# Patient Record
Sex: Male | Born: 1950
Health system: Southern US, Community
[De-identification: ages and names within clinical notes are randomized; demographics above are authoritative.]

## PROBLEM LIST (undated history)

## (undated) DIAGNOSIS — I2510 Atherosclerotic heart disease of native coronary artery without angina pectoris: Secondary | ICD-10-CM

## (undated) DIAGNOSIS — I10 Essential (primary) hypertension: Secondary | ICD-10-CM

## (undated) DIAGNOSIS — E785 Hyperlipidemia, unspecified: Secondary | ICD-10-CM

## (undated) HISTORY — PX: CORONARY ARTERY BYPASS GRAFT: SHX141

## (undated) HISTORY — PX: TONSILLECTOMY AND ADENOIDECTOMY: SUR1326

## (undated) HISTORY — PX: VASECTOMY: SHX75

---

## 2004-12-02 ENCOUNTER — Ambulatory Visit: Payer: Self-pay | Admitting: Unknown Physician Specialty

## 2007-02-11 ENCOUNTER — Other Ambulatory Visit: Payer: Self-pay

## 2007-02-11 ENCOUNTER — Emergency Department: Payer: Self-pay | Admitting: Emergency Medicine

## 2008-02-26 DIAGNOSIS — J45909 Unspecified asthma, uncomplicated: Secondary | ICD-10-CM | POA: Insufficient documentation

## 2008-05-21 DIAGNOSIS — C4491 Basal cell carcinoma of skin, unspecified: Secondary | ICD-10-CM

## 2008-05-21 HISTORY — DX: Basal cell carcinoma of skin, unspecified: C44.91

## 2008-07-05 ENCOUNTER — Ambulatory Visit: Payer: Self-pay | Admitting: Unknown Physician Specialty

## 2009-01-07 DIAGNOSIS — I10 Essential (primary) hypertension: Secondary | ICD-10-CM | POA: Insufficient documentation

## 2013-07-27 ENCOUNTER — Ambulatory Visit: Payer: Self-pay | Admitting: Unknown Physician Specialty

## 2013-07-27 LAB — HM COLONOSCOPY

## 2013-07-31 LAB — PATHOLOGY REPORT

## 2013-12-07 DIAGNOSIS — I5022 Chronic systolic (congestive) heart failure: Secondary | ICD-10-CM | POA: Insufficient documentation

## 2013-12-07 DIAGNOSIS — I25810 Atherosclerosis of coronary artery bypass graft(s) without angina pectoris: Secondary | ICD-10-CM | POA: Insufficient documentation

## 2014-01-10 ENCOUNTER — Emergency Department: Payer: Self-pay | Admitting: Emergency Medicine

## 2014-01-10 LAB — COMPREHENSIVE METABOLIC PANEL WITH GFR
Albumin: 3.6 g/dL (ref 3.4–5.0)
Alkaline Phosphatase: 159 U/L — ABNORMAL HIGH
Anion Gap: 5 — ABNORMAL LOW (ref 7–16)
BUN: 17 mg/dL (ref 7–18)
Bilirubin,Total: 1.4 mg/dL — ABNORMAL HIGH (ref 0.2–1.0)
Calcium, Total: 8.5 mg/dL (ref 8.5–10.1)
Chloride: 104 mmol/L (ref 98–107)
Co2: 31 mmol/L (ref 21–32)
Creatinine: 1.15 mg/dL (ref 0.60–1.30)
EGFR (African American): >60
EGFR (Non-African Amer.): >60
Glucose: 86 mg/dL (ref 65–99)
Osmolality: 280 (ref 275–301)
Potassium: 3.8 mmol/L (ref 3.5–5.1)
SGOT(AST): 42 U/L — ABNORMAL HIGH (ref 15–37)
SGPT (ALT): 77 U/L — ABNORMAL HIGH
Sodium: 140 mmol/L (ref 136–145)
Total Protein: 7.1 g/dL (ref 6.4–8.2)

## 2014-01-10 LAB — CBC
HCT: 43.3 % (ref 40.0–52.0)
HGB: 14.9 g/dL (ref 13.0–18.0)
MCH: 32.1 pg (ref 26.0–34.0)
MCHC: 34.5 g/dL (ref 32.0–36.0)
MCV: 93 fL (ref 80–100)
Platelet: 208 x10 3/mm 3 (ref 150–440)
RBC: 4.65 x10 6/mm 3 (ref 4.40–5.90)
RDW: 13.1 % (ref 11.5–14.5)
WBC: 9.9 x10 3/mm 3 (ref 3.8–10.6)

## 2014-01-10 LAB — CK TOTAL AND CKMB (NOT AT ARMC)
CK, Total: 224 U/L (ref 39–308)
CK-MB: 0.9 ng/mL (ref 0.5–3.6)

## 2014-01-10 LAB — TROPONIN I: Troponin-I: <0.02 ng/mL

## 2014-03-28 LAB — BASIC METABOLIC PANEL
BUN: 19 mg/dL (ref 4–21)
CREATININE: 1.3 mg/dL (ref 0.6–1.3)
Glucose: 102 mg/dL
POTASSIUM: 4.7 mmol/L (ref 3.4–5.3)
Sodium: 142 mmol/L (ref 137–147)

## 2014-03-28 LAB — CBC AND DIFFERENTIAL
HCT: 43 % (ref 41–53)
Hemoglobin: 15 g/dL (ref 13.5–17.5)
Platelets: 230 10*3/uL (ref 150–399)
WBC: 7.7 10*3/mL

## 2014-03-28 LAB — HEPATIC FUNCTION PANEL
ALT: 18 U/L (ref 10–40)
AST: 23 U/L (ref 14–40)
Alkaline Phosphatase: 96 U/L (ref 25–125)
Bilirubin, Total: 1.2 mg/dL

## 2014-03-28 LAB — LIPID PANEL
CHOLESTEROL: 232 mg/dL — AB (ref 0–200)
HDL: 47 mg/dL (ref 35–70)
LDL Cholesterol: 161 mg/dL
Triglycerides: 120 mg/dL (ref 40–160)

## 2014-03-28 LAB — PSA: PSA: 0.4

## 2014-03-28 LAB — TSH: TSH: 2.61 u[IU]/mL (ref 0.41–5.90)

## 2015-01-09 ENCOUNTER — Other Ambulatory Visit: Payer: Self-pay | Admitting: Family Medicine

## 2015-03-04 DIAGNOSIS — N529 Male erectile dysfunction, unspecified: Secondary | ICD-10-CM | POA: Insufficient documentation

## 2015-03-04 DIAGNOSIS — E785 Hyperlipidemia, unspecified: Secondary | ICD-10-CM | POA: Insufficient documentation

## 2015-03-04 DIAGNOSIS — J4520 Mild intermittent asthma, uncomplicated: Secondary | ICD-10-CM | POA: Insufficient documentation

## 2015-03-04 DIAGNOSIS — D126 Benign neoplasm of colon, unspecified: Secondary | ICD-10-CM | POA: Insufficient documentation

## 2015-03-10 ENCOUNTER — Ambulatory Visit (INDEPENDENT_AMBULATORY_CARE_PROVIDER_SITE_OTHER): Payer: BLUE CROSS/BLUE SHIELD | Admitting: Family Medicine

## 2015-03-10 ENCOUNTER — Encounter: Payer: Self-pay | Admitting: Family Medicine

## 2015-03-10 VITALS — BP 138/90 | HR 72 | Temp 98.1°F | Resp 14 | Ht 69.5 in | Wt 190.2 lb

## 2015-03-10 DIAGNOSIS — J4520 Mild intermittent asthma, uncomplicated: Secondary | ICD-10-CM

## 2015-03-10 DIAGNOSIS — E785 Hyperlipidemia, unspecified: Secondary | ICD-10-CM | POA: Diagnosis not present

## 2015-03-10 DIAGNOSIS — I5022 Chronic systolic (congestive) heart failure: Secondary | ICD-10-CM

## 2015-03-10 DIAGNOSIS — F4329 Adjustment disorder with other symptoms: Secondary | ICD-10-CM | POA: Diagnosis not present

## 2015-03-10 DIAGNOSIS — I25810 Atherosclerosis of coronary artery bypass graft(s) without angina pectoris: Secondary | ICD-10-CM

## 2015-03-10 DIAGNOSIS — Z0000 Encounter for general adult medical examination without abnormal findings: Secondary | ICD-10-CM

## 2015-03-10 DIAGNOSIS — I10 Essential (primary) hypertension: Secondary | ICD-10-CM | POA: Diagnosis not present

## 2015-03-10 LAB — POCT URINALYSIS DIPSTICK
BILIRUBIN UA: NEGATIVE
Blood, UA: NEGATIVE
GLUCOSE UA: NEGATIVE
KETONES UA: NEGATIVE
LEUKOCYTES UA: NEGATIVE
NITRITE UA: NEGATIVE
Protein, UA: NEGATIVE
Spec Grav, UA: <=1.005
Urobilinogen, UA: 0.2
pH, UA: 7.5

## 2015-03-10 MED ORDER — TRAZODONE HCL 50 MG PO TABS: 25.0000 mg | ORAL_TABLET | Freq: Every evening | ORAL | Status: DC | PRN

## 2015-03-10 NOTE — Progress Notes (Signed)
Patient ID: Joseph Cooper, male   DOB: 08-14-1950, 65 y.o.   MRN: [id number]       Patient: Joseph Cooper, Male    DOB: Jan 06, 1951, 65 y.o.   MRN: [id number] Visit Date: 03/10/2015  Today's Provider: [name], PA   Chief Complaint  Patient presents with  . Annual Exam   Subjective:    Annual physical exam Joseph Cooper is a 65 y.o. male who presents today for health maintenance and complete physical. He feels well. He reports exercising none recently, but 3 days per week previously. He reports he is sleeping well (average 8 hours per night) .  ----------------------------------------------------------------- Tdap: 02/15/2012 Flu: 12/16/2014 Zoster: 02/15/2012 Colonoscopy: 07/27/2013 Pneumovax: 02/15/2012  Review of Systems  Constitutional: Negative.   HENT: Positive for congestion.   Eyes: Negative.   Respiratory: Negative.   Cardiovascular: Negative.   Endocrine: Negative.   Genitourinary: Negative.   Musculoskeletal: Negative.   Skin: Negative.   Allergic/Immunologic: Negative.   Neurological: Negative.   Hematological: Negative.   Psychiatric/Behavioral: Negative.     Social History      He  reports that he has never smoked. He has never used smokeless tobacco. He reports that he drinks alcohol. He reports that he does not use illicit drugs.       Social History   Social History  . Marital Status: Married    Spouse Name: N/A  . Number of Children: N/A  . Years of Education: N/A   Social History Main Topics  . Smoking status: Never Smoker   . Smokeless tobacco: Never Used  . Alcohol Use: 0.0 oz/week    0 Standard drinks or equivalent per week     Comment: occasionally   . Drug Use: No  . Sexual Activity: Not Asked   Other Topics Concern  . None   Social History Narrative    History reviewed. No pertinent past medical history.   Patient Active Problem List   Diagnosis Date Noted  . Benign neoplasm of colon 03/04/2015  . ED (erectile  dysfunction) of organic origin 03/04/2015  . HLD (hyperlipidemia) 03/04/2015  . Asthma, mild intermittent 03/04/2015  . Chronic systolic heart failure ([place]) 12/07/2013  . Arteriosclerosis of autologous vein coronary artery bypass graft 12/07/2013  . Benign essential HTN 01/07/2009  . Asthma, exogenous 02/26/2008  . CAD in native artery 01/05/2008    Past Surgical History  Procedure Laterality Date  . Vasectomy    . Coronary artery bypass graft    . Tonsillectomy and adenoidectomy      Family History        Family Status  Relation Status Death Age  . Mother Alive   . Father Deceased 18  . Sister Deceased   . Daughter Alive   . Maternal Grandmother Deceased   . Maternal Grandfather Deceased 20  . Paternal Grandmother Deceased   . Paternal Grandfather Deceased   . Daughter Alive         His family history includes CAD in his father; Diabetes in his sister; Healthy in his daughter, daughter, and mother; Heart attack in his maternal grandfather, maternal grandmother, paternal grandfather, and sister; Heart disease in his father; Stroke in his father.    No Known Allergies  Previous Medications   ASPIRIN 325 MG TABLET       CARVEDILOL (COREG) 12.5 MG TABLET    CARVEDILOL, 12.5MG  (Oral Tablet)  1 Every Day for 0 days  Quantity: 0.00;  Refills: 0  Ordered :16-Jan-2010  Abran Richard ;  Started 07-Jan-2009 Active   EZETIMIBE (ZETIA) 10 MG TABLET    TAKE 1 TABLET (10 MG TOTAL) BY MOUTH ONCE DAILY.   LISINOPRIL (PRINIVIL,ZESTRIL) 20 MG TABLET    Take by mouth.   PROAIR HFA 108 (90 BASE) MCG/ACT INHALER    TAKE 1-2 PUFFS BY MOUTH 4 TIMES A DAY AS NEEDED   TRIAMTERENE-HYDROCHLOROTHIAZIDE (MAXZIDE-25) 37.5-25 MG TABLET    TRIAMTERENE-HCTZ, 37.5-25MG  (Oral Tablet)  1 Every Day for 0 days  Quantity: 0.00;  Refills: 0   Ordered :16-Jan-2010  Abran Richard ;  Started 07-Jan-2009 Active    Patient Care Team: Margo Common, PA as PCP - General (Family Medicine)     Objective:    Vitals: BP 140/88 mmHg  Pulse 72  Temp(Src) 98.1 F (36.7 C) (Oral)  Resp 14  Ht 5' 9.5" (1.765 m)  Wt 190 lb 3.2 oz (86.274 kg)  BMI 27.69 kg/m2    Physical Exam  Constitutional: He is oriented to person, place, and time. He appears well-developed and well-nourished.  HENT:  Head: Normocephalic and atraumatic.  Right Ear: External ear normal.  Left Ear: External ear normal.  Nose: Nose normal.  Mouth/Throat: Oropharynx is clear and moist.  Eyes: Conjunctivae and EOM are normal. Pupils are equal, round, and reactive to light. Right eye exhibits no discharge.  Neck: Normal range of motion. Neck supple. No tracheal deviation present. No thyromegaly present.  Cardiovascular: Normal rate, regular rhythm, normal heart sounds and intact distal pulses.   No murmur heard. Pulmonary/Chest: Effort normal and breath sounds normal. No respiratory distress. He has no wheezes. He has no rales. He exhibits no tenderness.  Status post 5 vessel CABG in 2009.  Abdominal: Soft. He exhibits no distension and no mass. There is no tenderness. There is no rebound and no guarding.  Genitourinary: Rectum normal, prostate normal and penis normal. Guaiac negative stool.  Musculoskeletal: Normal range of motion. He exhibits no edema or tenderness.  Lymphadenopathy:    He has no cervical adenopathy.  Neurological: He is alert and oriented to person, place, and time. He has normal reflexes. No cranial nerve deficit. He exhibits normal muscle tone. Coordination normal.  Skin: Skin is warm and dry. No rash noted. No erythema.  Psychiatric: His behavior is normal. Judgment and thought content normal. His mood appears anxious. His affect is blunt.   Depression Screen Having stress with job change and financial issues causing need to sell home. Feels he is not sleeping well and having some anxiety.    Assessment & Plan:     Routine Health Maintenance and Physical Exam  Exercise Activities and Dietary  recommendations Goals    Recommend he get back on his 3 day a week work out.and low fat diet.      Immunization History  Administered Date(s) Administered  . Pneumococcal Polysaccharide-23 02/15/2012  . Tdap 02/15/2012  . Zoster 02/15/2012    Health Maintenance  Topic Date Due  . Hepatitis C Screening  1950/08/12  . HIV Screening  08/01/1966  . INFLUENZA VACCINE  09/02/2015  . TETANUS/TDAP  02/14/2022  . COLONOSCOPY  07/28/2023  . ZOSTAVAX  Completed      Discussed health benefits of physical activity, and encouraged him to engage in regular exercise appropriate for his age and condition.    -------------------------------------------------------------------- 1. Annual physical exam Stable general health. Immunizations and colonoscopy up to date. Recommend he restart his routine exercise 3 times a week. - POCT  urinalysis dipstick  2. Arteriosclerosis of autologous vein coronary artery bypass graft Stable without chest pain/angina. EKG normal sinus rhythm. Recheck labs and continue annual follow up with cardiologist. - EKG 12-Lead - COMPLETE METABOLIC PANEL WITH GFR; Future - Lipid panel; Future  3. Benign essential HTN Stable and tolerating Mazide-25 qd with Lisionpril 20 mg qd and Coreg 12.5 mg qd. Recheck routine labs fasting and continue present medications. - CBC with Differential/Platelet; Future - COMPLETE METABOLIC PANEL WITH GFR; Future - Lipid panel; Future - TSH; Future  4. Chronic systolic heart failure (HCC) Stable NYHA Class II CHF followed by Dr. Nehemiah Massed (cardiologist) annually. Denies peripheral edema and significant dyspnea. - COMPLETE METABOLIC PANEL WITH GFR; Future - Lipid panel; Future  5. Asthma, mild intermittent, uncomplicated Rare flare. Keeps Albuterol inhaler handy to use prn rescue.  6. Stress and adjustment reaction Onset over the past few months and feels he is not sleeping very well. Will give trial of Trazodone to help with sleep,  anxiety and depressive reaction to stressful situation. - traZODone (DESYREL) 50 MG tablet; Take 0.5-1 tablets (25-50 mg total) by mouth at bedtime as needed for sleep.  Dispense: 30 tablet; Refill: 3  7. HLD (hyperlipidemia) Presently on Zetia 10 mg qd without side effects prescribed by Dr. Nehemiah Massed. Will recheck labs and continue present dosage with low fat diet and exercise.

## 2015-03-10 NOTE — Patient Instructions (Signed)
Exercising to Stay Healthy Exercising regularly is important. It has many health benefits, such as:  Improving your overall fitness, flexibility, and endurance.  Increasing your bone density.  Helping with weight control.  Decreasing your body fat.  Increasing your muscle strength.  Reducing stress and tension.  Improving your overall health. In order to become healthy and stay healthy, it is recommended that you do moderate-intensity and vigorous-intensity exercise. You can tell that you are exercising at a moderate intensity if you have a higher heart rate and faster breathing, but you are still able to hold a conversation. You can tell that you are exercising at a vigorous intensity if you are breathing much harder and faster and cannot hold a conversation while exercising. HOW OFTEN SHOULD I EXERCISE? Choose an activity that you enjoy and set realistic goals. Your health care provider can help you to make an activity plan that works for you. Exercise regularly as directed by your health care provider. This may include:   Doing resistance training twice each week, such as:  Push-ups.  Sit-ups.  Lifting weights.  Using resistance bands.  Doing a given intensity of exercise for a given amount of time. Choose from these options:  150 minutes of moderate-intensity exercise every week.  75 minutes of vigorous-intensity exercise every week.  A mix of moderate-intensity and vigorous-intensity exercise every week. Children, pregnant women, people who are out of shape, people who are overweight, and older adults may need to consult a health care provider for individual recommendations. If you have any sort of medical condition, be sure to consult your health care provider before starting a new exercise program.  WHAT ARE SOME EXERCISE IDEAS? Some moderate-intensity exercise ideas include:   Walking at a rate of 1 mile in 15  minutes.  Biking.  Hiking.  Golfing.  Dancing. Some vigorous-intensity exercise ideas include:   Walking at a rate of at least 4.5 miles per hour.  Jogging or running at a rate of 5 miles per hour.  Biking at a rate of at least 10 miles per hour.  Lap swimming.  Roller-skating or in-line skating.  Cross-country skiing.  Vigorous competitive sports, such as football, basketball, and soccer.  Jumping rope.  Aerobic dancing. WHAT ARE SOME EVERYDAY ACTIVITIES THAT CAN HELP ME TO GET EXERCISE?  Yard work, such as:  Pushing a lawn mower.  Raking and bagging leaves.  Washing and waxing your car.  Pushing a stroller.  Shoveling snow.  Gardening.  Washing windows or floors. HOW CAN I BE MORE ACTIVE IN MY DAY-TO-DAY ACTIVITIES?  Use the stairs instead of the elevator.  Take a walk during your lunch break.  If you drive, park your car farther away from work or school.  If you take public transportation, get off one stop early and walk the rest of the way.  Make all of your phone calls while standing up and walking around.  Get up, stretch, and walk around every 30 minutes throughout the day. WHAT GUIDELINES SHOULD I FOLLOW WHILE EXERCISING?  Do not exercise so much that you hurt yourself, feel dizzy, or get very short of breath.  Consult your health care provider before starting a new exercise program.  Wear comfortable clothes and shoes with good support.  Drink plenty of water while you exercise to prevent dehydration or heat stroke. Body water is lost during exercise and must be replaced.  Work out until you breathe faster and your heart beats faster.   This information   is not intended to replace advice given to you by your health care provider. Make sure you discuss any questions you have with your health care provider.   Document Released: 02/20/2010 Document Revised: 02/08/2014 Document Reviewed: 06/21/2013 Elsevier Interactive Patient Education  2016 [employer] Inc[employer]  

## 2015-05-24 IMAGING — CT CT HEAD WITHOUT CONTRAST
1 series · 16 of 30 positions shown, 20 images · non-contrast
Comparison: None.

CLINICAL DATA: Lightheadedness.  Vision loss.  Initial encounter.

EXAM:
CT HEAD WITHOUT CONTRAST
TECHNIQUE: Contiguous axial images were obtained from the base of the skull
through the vertex without intravenous contrast.

[Series 2: head wo · axial · 0.42mm/px · z∈[+1044,+1188]mm · 16 of 36 slices shown, 20 images]
[im 2/36  brain]
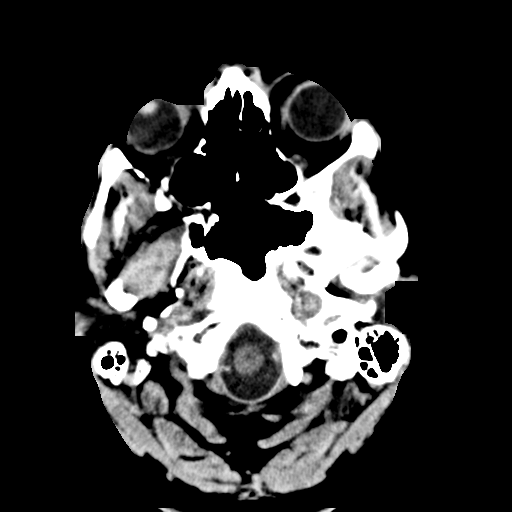
[im 2/36  bone]
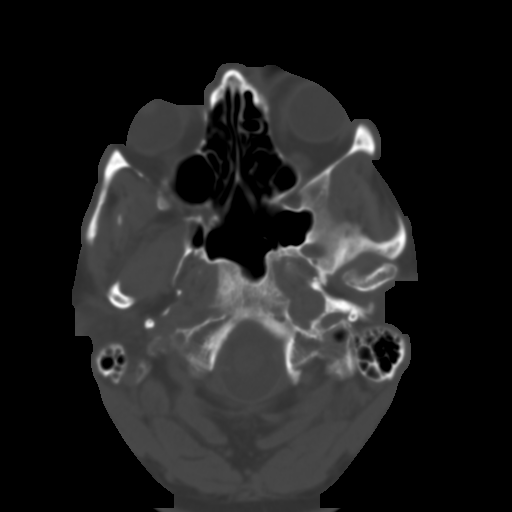
[im 4/36  brain]
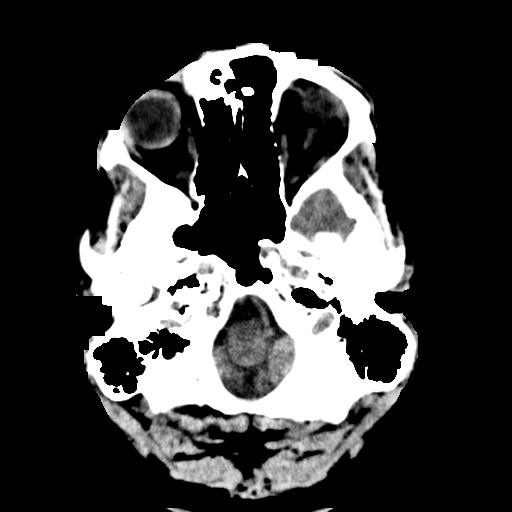
[im 7/36  brain]
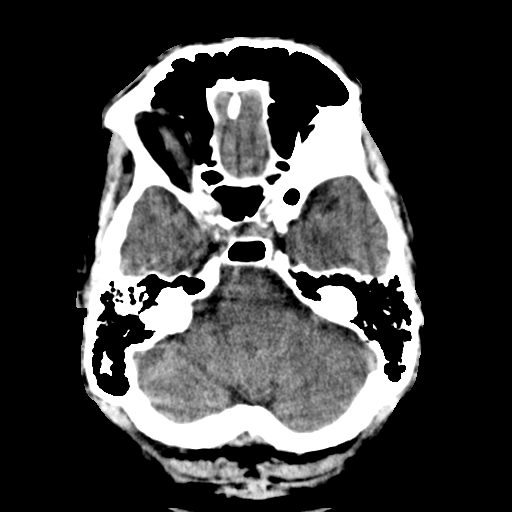
[im 9/36  brain]
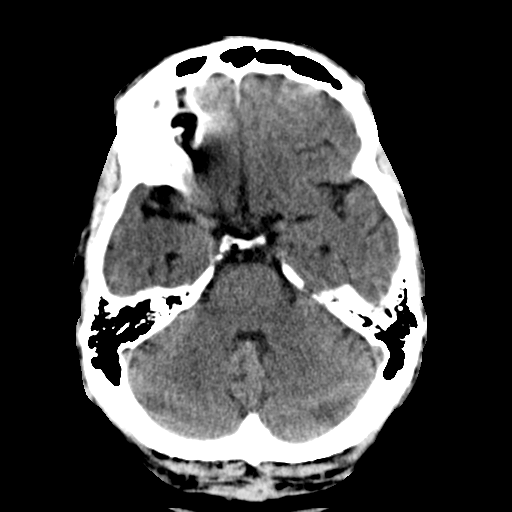
[im 10/36  brain]
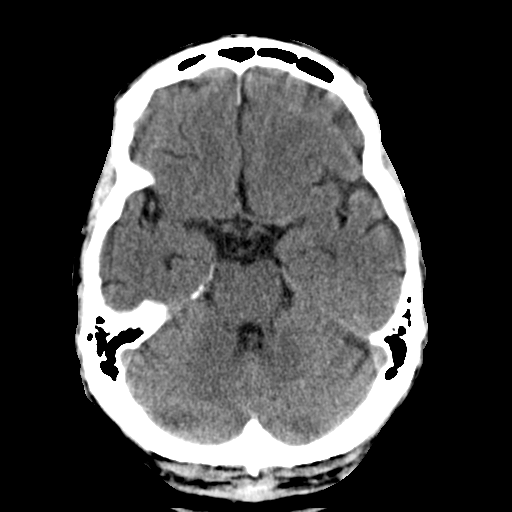
[im 10/36  bone]
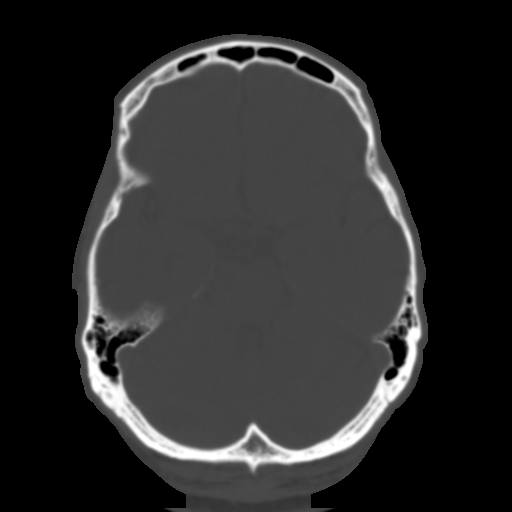
[im 13/36  brain]
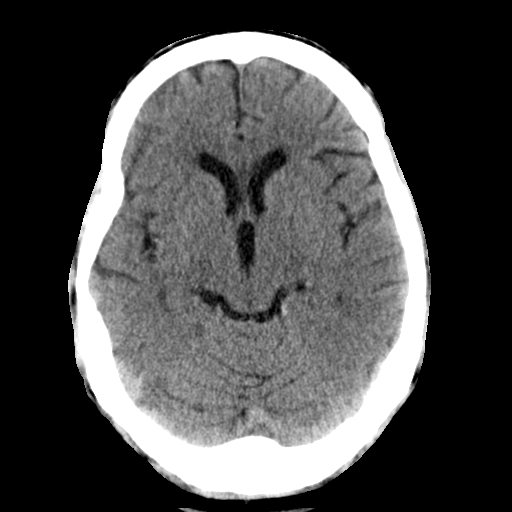
[im 15/36  brain]
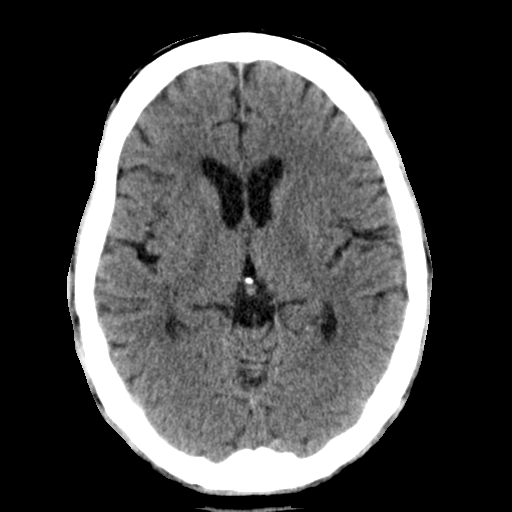
[im 17/36  brain]
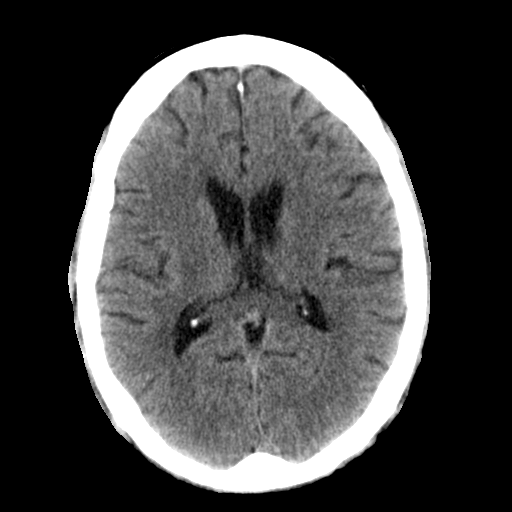
[im 19/36  brain]
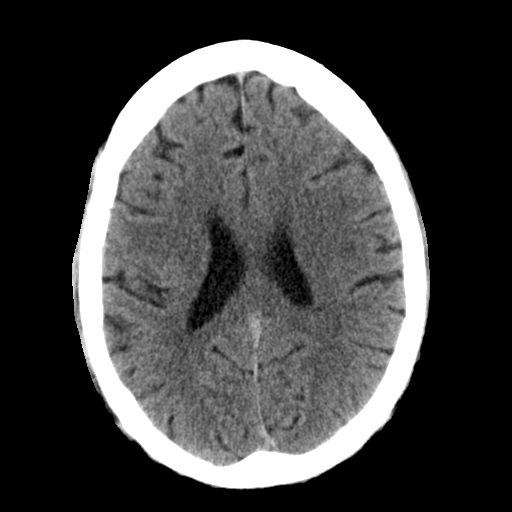
[im 19/36  bone]
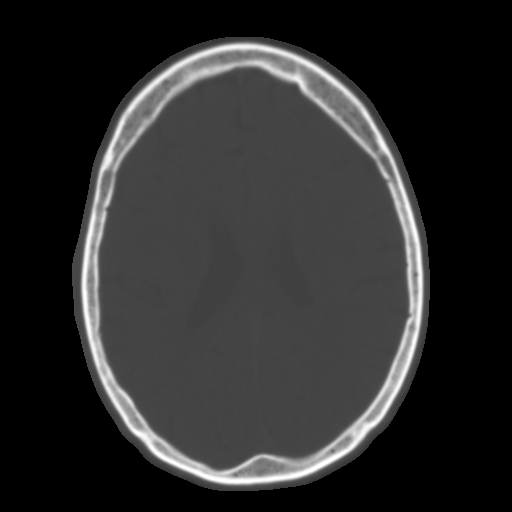
[im 21/36  brain]
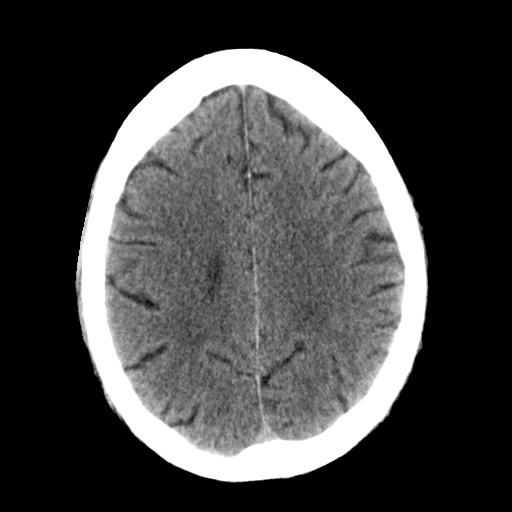
[im 23/36  brain]
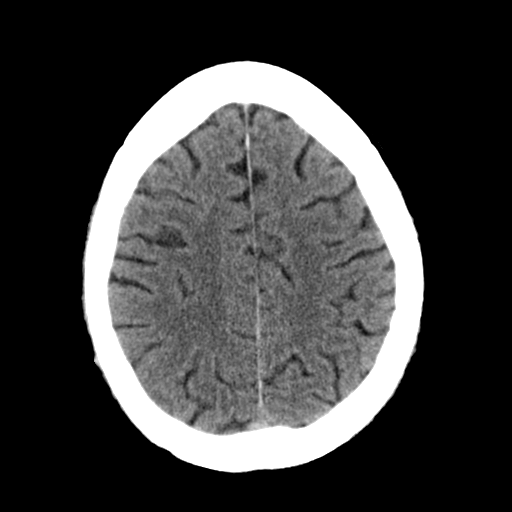
[im 26/36  brain]
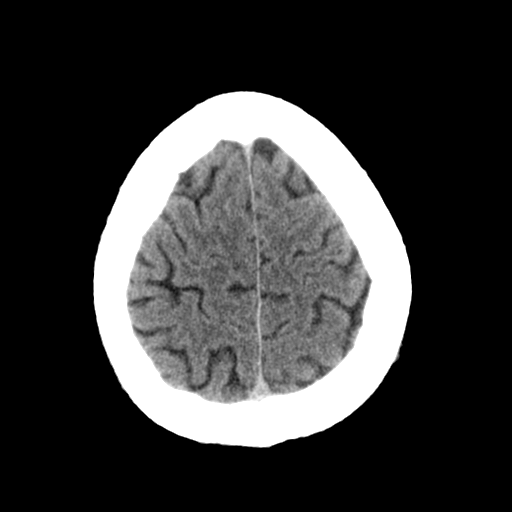
[im 27/36  brain]
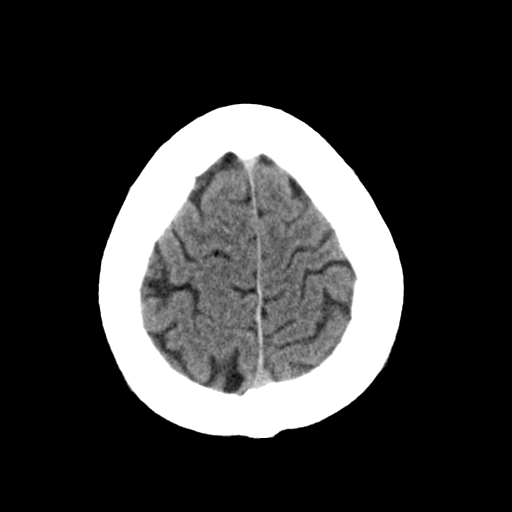
[im 27/36  bone]
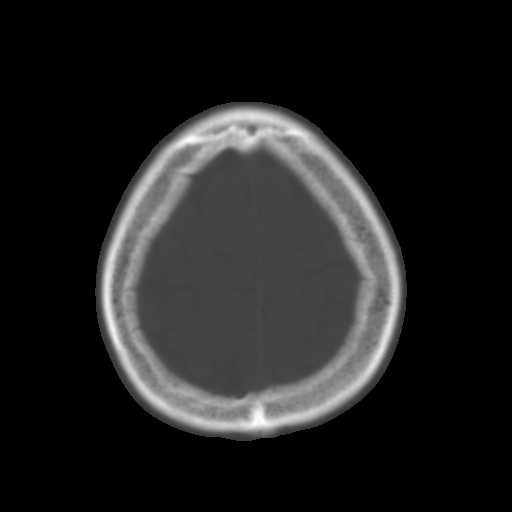
[im 29/36  brain]
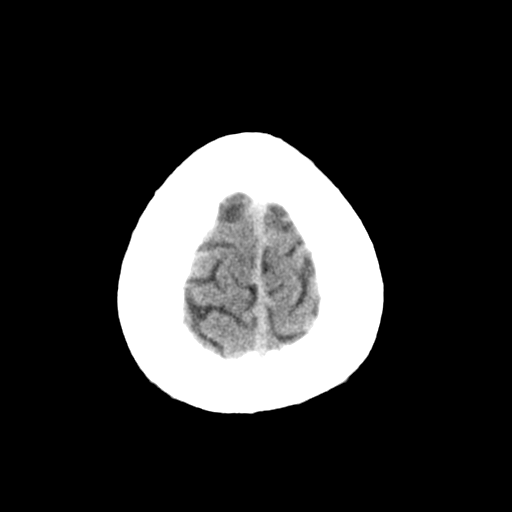
[im 32/36  brain]
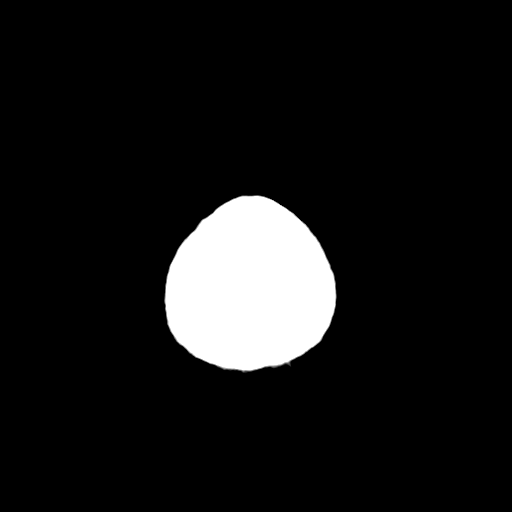
[im 34/36  brain]
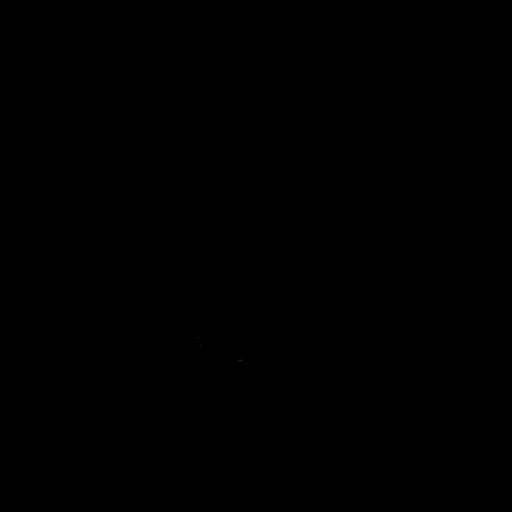

[16 of 30 positions shown; findings below may reference images not displayed]

FINDINGS: No mass lesion, mass effect, midline shift, hydrocephalus,
hemorrhage. No territorial ischemia or acute infarction.
Intracranial atherosclerosis. The visible paranasal sinuses are
within normal limits. Both mastoid air cells are clear.
IMPRESSION: Negative CT head.

## 2015-09-12 ENCOUNTER — Other Ambulatory Visit: Payer: Self-pay | Admitting: Family Medicine

## 2015-09-13 LAB — CBC WITH DIFFERENTIAL/PLATELET
BASOS ABS: 0.1 10*3/uL (ref 0.0–0.2)
Basos: 1 %
EOS (ABSOLUTE): 0.6 10*3/uL — AB (ref 0.0–0.4)
EOS: 8 %
HEMATOCRIT: 41.2 % (ref 37.5–51.0)
Hemoglobin: 14.4 g/dL (ref 12.6–17.7)
IMMATURE GRANULOCYTES: 0 %
Immature Grans (Abs): 0 10*3/uL (ref 0.0–0.1)
Lymphocytes Absolute: 1.6 10*3/uL (ref 0.7–3.1)
Lymphs: 21 %
MCH: 32.1 pg (ref 26.6–33.0)
MCHC: 35 g/dL (ref 31.5–35.7)
MCV: 92 fL (ref 79–97)
MONOS ABS: 0.7 10*3/uL (ref 0.1–0.9)
Monocytes: 10 %
NEUTROS PCT: 60 %
Neutrophils Absolute: 4.3 10*3/uL (ref 1.4–7.0)
PLATELETS: 179 10*3/uL (ref 150–379)
RBC: 4.48 x10E6/uL (ref 4.14–5.80)
RDW: 12.9 % (ref 12.3–15.4)
WBC: 7.3 10*3/uL (ref 3.4–10.8)

## 2015-09-13 LAB — COMPREHENSIVE METABOLIC PANEL
ALK PHOS: 90 IU/L (ref 39–117)
ALT: 32 IU/L (ref 0–44)
AST: 31 IU/L (ref 0–40)
Albumin/Globulin Ratio: 2.3 — ABNORMAL HIGH (ref 1.2–2.2)
Albumin: 4.6 g/dL (ref 3.6–4.8)
BUN/Creatinine Ratio: 14 (ref 10–24)
BUN: 19 mg/dL (ref 8–27)
Bilirubin Total: 1.8 mg/dL — ABNORMAL HIGH (ref 0.0–1.2)
CALCIUM: 9.5 mg/dL (ref 8.6–10.2)
CO2: 27 mmol/L (ref 18–29)
CREATININE: 1.33 mg/dL — AB (ref 0.76–1.27)
Chloride: 100 mmol/L (ref 96–106)
GFR calc Af Amer: 65 mL/min/{1.73_m2} (ref >59)
GFR, EST NON AFRICAN AMERICAN: 56 mL/min/{1.73_m2} — AB (ref >59)
GLUCOSE: 106 mg/dL — AB (ref 65–99)
Globulin, Total: 2 g/dL (ref 1.5–4.5)
Potassium: 4.1 mmol/L (ref 3.5–5.2)
Sodium: 143 mmol/L (ref 134–144)
Total Protein: 6.6 g/dL (ref 6.0–8.5)

## 2015-09-13 LAB — LIPID PANEL
CHOL/HDL RATIO: 2.7 ratio (ref 0.0–5.0)
CHOLESTEROL TOTAL: 137 mg/dL (ref 100–199)
HDL: 50 mg/dL (ref >39)
LDL CALC: 72 mg/dL (ref 0–99)
TRIGLYCERIDES: 75 mg/dL (ref 0–149)
VLDL Cholesterol Cal: 15 mg/dL (ref 5–40)

## 2015-09-13 LAB — TSH: TSH: 1.9 u[IU]/mL (ref 0.450–4.500)

## 2015-09-18 ENCOUNTER — Telehealth: Payer: Self-pay

## 2015-09-18 NOTE — Telephone Encounter (Signed)
LMTCB  Thanks, [employer]

## 2015-09-18 NOTE — Telephone Encounter (Signed)
-----   Message from Margo Common, [place] sent at 09/18/2015  2:48 PM EDT ----- All blood tests in very good shape. Slight increase in creatinine indicating mild stress on kidneys. Drinking extra fluids should help. Recheck progress in 1 month.

## 2015-09-19 NOTE — Telephone Encounter (Signed)
Pt advised.   Thanks,   -Audrianna Driskill  

## 2015-10-30 ENCOUNTER — Emergency Department
Admission: EM | Admit: 2015-10-30 | Discharge: 2015-10-30 | Disposition: A | Discharge status: Home / Self Care | Payer: BLUE CROSS/BLUE SHIELD | Attending: Emergency Medicine | Admitting: Emergency Medicine

## 2015-10-30 ENCOUNTER — Other Ambulatory Visit: Payer: Self-pay

## 2015-10-30 ENCOUNTER — Emergency Department: Payer: BLUE CROSS/BLUE SHIELD

## 2015-10-30 DIAGNOSIS — I5022 Chronic systolic (congestive) heart failure: Secondary | ICD-10-CM | POA: Insufficient documentation

## 2015-10-30 DIAGNOSIS — I110 Hypertensive heart disease with heart failure: Secondary | ICD-10-CM | POA: Insufficient documentation

## 2015-10-30 DIAGNOSIS — R55 Syncope and collapse: Secondary | ICD-10-CM | POA: Diagnosis present

## 2015-10-30 DIAGNOSIS — J4520 Mild intermittent asthma, uncomplicated: Secondary | ICD-10-CM | POA: Insufficient documentation

## 2015-10-30 DIAGNOSIS — R61 Generalized hyperhidrosis: Secondary | ICD-10-CM | POA: Diagnosis not present

## 2015-10-30 DIAGNOSIS — Z7982 Long term (current) use of aspirin: Secondary | ICD-10-CM | POA: Insufficient documentation

## 2015-10-30 DIAGNOSIS — Z79899 Other long term (current) drug therapy: Secondary | ICD-10-CM | POA: Diagnosis not present

## 2015-10-30 DIAGNOSIS — I2510 Atherosclerotic heart disease of native coronary artery without angina pectoris: Secondary | ICD-10-CM | POA: Diagnosis not present

## 2015-10-30 DIAGNOSIS — Z951 Presence of aortocoronary bypass graft: Secondary | ICD-10-CM | POA: Diagnosis not present

## 2015-10-30 HISTORY — DX: Atherosclerotic heart disease of native coronary artery without angina pectoris: I25.10

## 2015-10-30 LAB — TROPONIN I

## 2015-10-30 LAB — BRAIN NATRIURETIC PEPTIDE: B Natriuretic Peptide: 41 pg/mL (ref 0.0–100.0)

## 2015-10-30 LAB — BASIC METABOLIC PANEL
ANION GAP: 5 (ref 5–15)
BUN: 19 mg/dL (ref 6–20)
CALCIUM: 9.2 mg/dL (ref 8.9–10.3)
CO2: 28 mmol/L (ref 22–32)
Chloride: 107 mmol/L (ref 101–111)
Creatinine, Ser: 1.27 mg/dL — ABNORMAL HIGH (ref 0.61–1.24)
GFR, EST NON AFRICAN AMERICAN: 58 mL/min — AB (ref >60)
Glucose, Bld: 104 mg/dL — ABNORMAL HIGH (ref 65–99)
POTASSIUM: 3.3 mmol/L — AB (ref 3.5–5.1)
SODIUM: 140 mmol/L (ref 135–145)

## 2015-10-30 LAB — CBC WITH DIFFERENTIAL/PLATELET
BASOS ABS: 0.1 10*3/uL (ref 0–0.1)
Basophils Relative: 1 %
EOS ABS: 0.5 10*3/uL (ref 0–0.7)
Eosinophils Relative: 6 %
HCT: 42.8 % (ref 40.0–52.0)
HEMOGLOBIN: 15 g/dL (ref 13.0–18.0)
Lymphocytes Relative: 22 %
Lymphs Abs: 1.9 10*3/uL (ref 1.0–3.6)
MCH: 31.9 pg (ref 26.0–34.0)
MCHC: 35 g/dL (ref 32.0–36.0)
MCV: 91 fL (ref 80.0–100.0)
Monocytes Absolute: 0.7 10*3/uL (ref 0.2–1.0)
Monocytes Relative: 9 %
NEUTROS PCT: 62 %
Neutro Abs: 5.3 10*3/uL (ref 1.4–6.5)
PLATELETS: 184 10*3/uL (ref 150–440)
RBC: 4.7 MIL/uL (ref 4.40–5.90)
RDW: 13.1 % (ref 11.5–14.5)
WBC: 8.5 10*3/uL (ref 3.8–10.6)

## 2015-10-30 NOTE — ED Provider Notes (Signed)
[facility] Emergency Department Provider Note  ____________________________________________  Time seen: Approximately 12:48 PM  I have reviewed the triage vital signs and the nursing notes.   HISTORY  Chief Complaint Near Syncope   HPI Joseph Cooper is a 65 y.o. male with a history of coronary artery disease status post CABG in 2009, CHF (EF 35%), mild intermittent asthma, hypertension who presents for evaluation of a syncopal episode. Patient reports that he was in usual state of health this morning when he went to the post office. He was standing at the help desk when he started to feel diaphoretic, dizzy, severely nauseated. He sat down and reports that the symptoms got progressively worse to the point that he fell on the ground. He reports that he couldn't see anything but could hear people talking to him so he does not think that he fully passed out. He reports one similar prior episode in the past but he was not seen that time. He deniespalpitations, chest pain, shortness of breath, abdominal pain, back pain both preceding or after this episode. Patient right now endorses that he feels back to baseline. He sustained no trauma. He denies headache, facial droop, unilateral weakness or numbness, gait instability, slurred speech, difficulty finding words. He reports that he had a very small breakfast this morning and didn't drink anything. He is not a smoker. No alcohol. He was seen by Dr. Nehemiah Massed for his annual checkup in May 2017 where he had a normal stress test. Patient had an echo which showed an EF of 35%  Past Medical History:  Diagnosis Date  . CAD (coronary artery disease)     Patient Active Problem List   Diagnosis Date Noted  . Benign neoplasm of colon 03/04/2015  . ED (erectile dysfunction) of organic origin 03/04/2015  . HLD (hyperlipidemia) 03/04/2015  . Asthma, mild intermittent 03/04/2015  . Chronic systolic heart failure ([place]) 12/07/2013    . Arteriosclerosis of autologous vein coronary artery bypass graft 12/07/2013  . Benign essential HTN 01/07/2009  . Asthma, exogenous 02/26/2008  . CAD in native artery 01/05/2008    Past Surgical History:  Procedure Laterality Date  . CORONARY ARTERY BYPASS GRAFT    . TONSILLECTOMY AND ADENOIDECTOMY    . VASECTOMY      Prior to Admission medications   Medication Sig Start Date End Date Taking? Authorizing Provider  aspirin 325 MG tablet  01/07/09  Yes Historical Provider, MD  carvedilol (COREG) 12.5 MG tablet CARVEDILOL, 12.5MG  (Oral Tablet)  1 Every Day for 0 days  Quantity: 0.00;  Refills: 0   Ordered :16-Jan-2010  Abran Richard ;  Started 07-Jan-2009 Active 01/07/09  Yes Historical Provider, MD  lisinopril (PRINIVIL,ZESTRIL) 40 MG tablet Take 40 mg by mouth.  01/07/09  Yes Historical Provider, MD  PROAIR HFA 108 (90 BASE) MCG/ACT inhaler TAKE 1-2 PUFFS BY MOUTH 4 TIMES A DAY AS NEEDED 01/09/15  Yes Dennis E Chrismon, PA  rosuvastatin (CRESTOR) 10 MG tablet Take 1 tablet by mouth daily. 09/24/15 09/23/16 Yes Historical Provider, MD  traZODone (DESYREL) 50 MG tablet Take 0.5-1 tablets (25-50 mg total) by mouth at bedtime as needed for sleep. 03/10/15  Yes Vickki Muff Chrismon, PA  triamterene-hydrochlorothiazide (MAXZIDE-25) 37.5-25 MG tablet 1 tablet daily 01/07/09  Yes Historical Provider, MD  ezetimibe (ZETIA) 10 MG tablet TAKE 1 TABLET (10 MG TOTAL) BY MOUTH ONCE DAILY. 03/06/15   Historical Provider, MD    Allergies Review of patient's allergies indicates no known allergies.  Family History  Problem Relation Age of Onset  . Healthy Mother   . Stroke Father   . CAD Father   . Heart disease Father   . Diabetes Sister   . Heart attack Sister   . Healthy Daughter   . Heart attack Maternal Grandmother   . Heart attack Maternal Grandfather   . Heart attack Paternal Grandfather   . Healthy Daughter     Social History Social History  Substance Use Topics  . Smoking status: Never  Smoker  . Smokeless tobacco: Never Used  . Alcohol use 0.0 oz/week     Comment: occasionally     Review of Systems Constitutional: Negative for fever. + syncope Eyes: Negative for visual changes. ENT: Negative for sore throat. Cardiovascular: Negative for chest pain. + diaphoresis Respiratory: Negative for shortness of breath. Gastrointestinal: Negative for abdominal pain, vomiting or diarrhea. + nausea Genitourinary: Negative for dysuria. Musculoskeletal: Negative for back pain. Skin: Negative for rash. Neurological: Negative for headaches, weakness or numbness.  ____________________________________________   PHYSICAL EXAM:  VITAL SIGNS: ED Triage Vitals  Enc Vitals Group     BP 10/30/15 1246 102/71     Pulse Rate 10/30/15 1246 (!) 58     Resp 10/30/15 1246 18     Temp 10/30/15 1244 (!) 96.9 F (36.1 C)     Temp src --      SpO2 10/30/15 1246 96 %     Weight 10/30/15 1247 190 lb (86.2 kg)     Height 10/30/15 1247 5\' 10"  (1.778 m)     Head Circumference --      Peak Flow --      Pain Score --      Pain Loc --      Pain Edu? --      Excl. in [place]? --     Constitutional: Alert and oriented. Well appearing and in no apparent distress. HEENT:      Head: Normocephalic and atraumatic.         Eyes: Conjunctivae are normal. Sclera is non-icteric. EOMI. PERRL      Mouth/Throat: Mucous membranes are moist.       Neck: Supple with no signs of meningismus. Cardiovascular: Regular rate and rhythm. No murmurs, gallops, or rubs. 2+ symmetrical distal pulses are present in all extremities. No JVD. Respiratory: Normal respiratory effort. Lungs are clear to auscultation bilaterally. No wheezes, crackles, or rhonchi.  Gastrointestinal: Soft, non tender, and non distended with positive bowel sounds. No rebound or guarding. Genitourinary: No CVA tenderness. Musculoskeletal: Nontender with normal range of motion in all extremities. No edema, cyanosis, or erythema of  extremities. Neurologic: Normal speech and language. A & O x3, PERRL, no nystagmus, CN II-XII intact, motor testing reveals good tone and bulk throughout. There is no evidence of pronator drift or dysmetria. Muscle strength is 5/5 throughout. Deep tendon reflexes are 2+ throughout with downgoing toes. Sensory examination is intact. Gait is normal. Skin: Skin is warm, dry and intact. No rash noted. Psychiatric: Mood and affect are normal. Speech and behavior are normal.  ____________________________________________   LABS (all labs ordered are listed, but only abnormal results are displayed)  Labs Reviewed  BASIC METABOLIC PANEL - Abnormal; Notable for the following:       Result Value   Potassium 3.3 (*)    Glucose, Bld 104 (*)    Creatinine, Ser 1.27 (*)    GFR calc non Af Amer 58 (*)    All other components within  normal limits  CBC WITH DIFFERENTIAL/PLATELET  TROPONIN I  BRAIN NATRIURETIC PEPTIDE  TROPONIN I  TROPONIN I   ____________________________________________  EKG  ED ECG REPORT I, Rudene Re, the attending physician, personally viewed and interpreted this ECG.  Sinus bradycardia, rate 56,  normal intervals, normal axis, no STE or depressions, no evidence of HOCM, AV block, delta wave, ARVD, prolonged QTc, WPW.  ____________________________________________  RADIOLOGY  CXR: Negative ____________________________________________   PROCEDURES  Procedure(s) performed: None Procedures Critical Care performed:  None ____________________________________________   INITIAL IMPRESSION / ASSESSMENT AND PLAN / ED COURSE   65 y.o. male with a history of coronary artery disease status post CABG in 2009, CHF (EF 35%), mild intermittent asthma, hypertension who presents for evaluation of a syncopal episode while standing in line and the post office. Patient reports that he felt extremely diaphoretic, nauseous, and lightheaded prior to this episode. Did not sustain  any injuries. Patient is back to his baseline at this time. He denies any chest pain, palpitations, headache, or neurological deficits. His neuro is intact. His physical exam is within normal limits. EKG with no evidence of arrhythmia or ischemia. Watch patient on telemetry, check blood work, give gentle hydration as patient has CHF and cycle cardiac markers. We'll do one troponin at this time and the second one 6 hours from when this episode happened which will be 6 PM. Everything is negative we'll discuss with Dr. Nehemiah Massed for further management. No indication for head CT with no head trauma, neurologically intact, no headache, no blood thinners   Clinical Course  Comment By Time  Discussed with Dr. Jerrye Beavers who agrees with troponin x 2 and if negative and patient remains with no further episodes and no arrhythmia on telemetry, discharge with close f/u Friday or Monday with Nehemiah Massed.  Rudene Re, MD 09/28 1354  Patient remains at baseline with no further episodes of dizziness. His workup here was essentially normal with a 3 troponin is negative and the last one 6 hours from the resolution of his symptoms. He is eating and tolerating dinner without any difficulty. I have emailed  Dr. Nehemiah Massed for a close f/u. I recommended the patient call his office tomorrow for close follow-up appointment. Discussed return precautions with patient. Patient is comfortable with the plan and follow-up. Rudene Re, MD 09/28 1826    Pertinent labs & imaging results that were available during my care of the patient were reviewed by me and considered in my medical decision making (see chart for details).    ____________________________________________   FINAL CLINICAL IMPRESSION(S) / ED DIAGNOSES  Final diagnoses:  Syncope, unspecified syncope type      NEW MEDICATIONS STARTED DURING THIS VISIT:  Discharge Medication List as of 10/30/2015  6:28 PM       Note:  This document was prepared using  Dragon voice recognition software and may include unintentional dictation errors.    Rudene Re, MD 10/30/15 [phone]

## 2015-10-30 NOTE — ED Triage Notes (Signed)
Pt comes into the Ed via EMS from local store , pt states he was going into the store and had sudden onset dizziness with diaphoresis and nausea, states he felt like he was going to pass out.[facility] reports orthostatic changes, pt denies any recent illness.[facility]

## 2015-10-30 NOTE — Discharge Instructions (Signed)
You have been seen today in the Emergency Department (ED)  for syncope (passing out).  Your workup including labs and EKG did not show a cause for your syncope and were overall reassuring.   ° °Your symptoms may be due to dehydration, so it is important that you drink plenty of non-alcoholic fluids. Emotional stress, pain, or overheating--especially if you have been standing--can make you faint. In these cases, fainting is usually not serious. But fainting can be a sign of a more serious problem. Therefore it is imperative that you follow up with your doctor in 1-2 days for further evaluation. ° °When should you call for help?  °Call 911 anytime you think you may need emergency care. For example, call if:  °You have symptoms of a heart problem. These may include:  °Chest pain or pressure.  °Severe trouble breathing.  °A fast or irregular heartbeat.  °Lightheadedness or sudden weakness.  °Coughing up pink, foamy mucus.  °Passing out. °You have symptoms of a stroke. These may include:  °Sudden numbness, tingling, weakness, or loss of movement in your face, arm, or leg, especially on only one side of your body.  °Sudden vision changes.  °Sudden trouble speaking.  °Sudden confusion or trouble understanding simple statements.  °Sudden problems with walking or balance.  °A sudden, severe headache that is different from past headaches. °You passed out (lost consciousness) again. ° °Watch closely for changes in your health, and be sure to contact your doctor if:  °You do not get better as expected. ° ° How can you care for yourself at home?  °Drink plenty of fluids to prevent dehydration. If you have kidney, heart, or liver disease and have to limit fluids, talk with your doctor before you increase your fluid intake. ° ° °

## 2015-12-19 ENCOUNTER — Telehealth: Payer: Self-pay | Admitting: Family Medicine

## 2015-12-19 NOTE — Telephone Encounter (Signed)
Pt is asking if we have any samples for PROAIR HFA 108 (90 BASE) MCG/ACT inhaler.  Pt states due to work he is on a budget.  CB#[phone]/MW

## 2015-12-19 NOTE — Telephone Encounter (Signed)
No samples available. Left patient a voicemail advising him.

## 2016-01-02 ENCOUNTER — Telehealth: Payer: Self-pay | Admitting: Family Medicine

## 2016-01-02 NOTE — Telephone Encounter (Signed)
Patient called requesting and given verbal permission to fax his last CPE (which was 2/17) and his Labs from 8/17 to the following. Thanks CC  [employer]  Atten: Augustin Schooling  Fax # [phone] Phone # [phone]

## 2016-01-06 DIAGNOSIS — N183 Chronic kidney disease, stage 3 unspecified: Secondary | ICD-10-CM | POA: Insufficient documentation

## 2016-03-11 ENCOUNTER — Ambulatory Visit (INDEPENDENT_AMBULATORY_CARE_PROVIDER_SITE_OTHER): Payer: BLUE CROSS/BLUE SHIELD | Admitting: Family Medicine

## 2016-03-11 ENCOUNTER — Encounter: Payer: Self-pay | Admitting: Family Medicine

## 2016-03-11 VITALS — BP 142/108 | HR 65 | Temp 97.9°F | Resp 14 | Ht 70.0 in | Wt 196.4 lb

## 2016-03-11 DIAGNOSIS — E782 Mixed hyperlipidemia: Secondary | ICD-10-CM | POA: Diagnosis not present

## 2016-03-11 DIAGNOSIS — Z0000 Encounter for general adult medical examination without abnormal findings: Secondary | ICD-10-CM | POA: Diagnosis not present

## 2016-03-11 DIAGNOSIS — I5022 Chronic systolic (congestive) heart failure: Secondary | ICD-10-CM

## 2016-03-11 DIAGNOSIS — Z1159 Encounter for screening for other viral diseases: Secondary | ICD-10-CM | POA: Diagnosis not present

## 2016-03-11 DIAGNOSIS — Z125 Encounter for screening for malignant neoplasm of prostate: Secondary | ICD-10-CM | POA: Diagnosis not present

## 2016-03-11 DIAGNOSIS — I10 Essential (primary) hypertension: Secondary | ICD-10-CM

## 2016-03-11 DIAGNOSIS — N183 Chronic kidney disease, stage 3 unspecified: Secondary | ICD-10-CM

## 2016-03-11 DIAGNOSIS — Z114 Encounter for screening for human immunodeficiency virus [HIV]: Secondary | ICD-10-CM | POA: Diagnosis not present

## 2016-03-11 DIAGNOSIS — I25810 Atherosclerosis of coronary artery bypass graft(s) without angina pectoris: Secondary | ICD-10-CM

## 2016-03-11 LAB — POCT URINALYSIS DIPSTICK
Bilirubin, UA: NEGATIVE
GLUCOSE UA: NEGATIVE
Ketones, UA: NEGATIVE
LEUKOCYTES UA: NEGATIVE
NITRITE UA: NEGATIVE
PROTEIN UA: NEGATIVE
SPEC GRAV UA: 1.01
UROBILINOGEN UA: 0.2
pH, UA: 6

## 2016-03-11 NOTE — Progress Notes (Signed)
Patient: Joseph Cooper, Male    DOB: Oct 21, 1950, 66 y.o.   MRN: [id number] Visit Date: 03/11/2016  Today's Provider: [name], PA   Chief Complaint  Patient presents with  . Annual Exam   Subjective:    Annual physical exam Joseph Cooper is a 66 y.o. male who presents today for health maintenance and complete physical. He feels well. He reports exercising very lightly. He reports he is sleeping well.  -----------------------------------------------------------------   Review of Systems  Constitutional: Negative.   HENT: Negative.   Eyes: Negative.   Respiratory: Negative.   Cardiovascular: Negative.   Gastrointestinal: Negative.   Endocrine: Negative.   Genitourinary: Negative.   Musculoskeletal: Negative.   Skin: Negative.   Allergic/Immunologic: Negative.   Neurological: Negative.   Hematological: Negative.   Psychiatric/Behavioral: Negative.     Social History      He  reports that he has never smoked. He has never used smokeless tobacco. He reports that he drinks alcohol. He reports that he does not use drugs.       Social History   Social History  . Marital status: Married    Spouse name: N/A  . Number of children: N/A  . Years of education: N/A   Social History Main Topics  . Smoking status: Never Smoker  . Smokeless tobacco: Never Used  . Alcohol use 0.0 oz/week     Comment: occasionally   . Drug use: No  . Sexual activity: Not Asked   Other Topics Concern  . None   Social History Narrative  . None    Past Medical History:  Diagnosis Date  . CAD (coronary artery disease)      Patient Active Problem List   Diagnosis Date Noted  . CKD (chronic kidney disease) stage 3, GFR 30-59 ml/min 01/06/2016  . Benign neoplasm of colon 03/04/2015  . ED (erectile dysfunction) of organic origin 03/04/2015  . HLD (hyperlipidemia) 03/04/2015  . Asthma, mild intermittent 03/04/2015  . Chronic systolic heart failure ([place]) 12/07/2013  .  Arteriosclerosis of autologous vein coronary artery bypass graft 12/07/2013  . Benign essential HTN 01/07/2009  . Asthma, exogenous 02/26/2008  . CAD in native artery 01/05/2008    Past Surgical History:  Procedure Laterality Date  . CORONARY ARTERY BYPASS GRAFT    . TONSILLECTOMY AND ADENOIDECTOMY    . VASECTOMY      Family History        Family Status  Relation Status  . Mother Alive  . Father Deceased at age 87  . Sister Deceased  . Daughter Alive  . Maternal Grandmother Deceased  . Maternal Grandfather Deceased at age 50  . Paternal Grandmother Deceased  . Paternal Grandfather Deceased  . Daughter Alive        His family history includes CAD in his father; Diabetes in his sister; Healthy in his daughter, daughter, and mother; Heart attack in his maternal grandfather, maternal grandmother, paternal grandfather, and sister; Heart disease in his father; Stroke in his father.     No Known Allergies   Current Outpatient Prescriptions:  .  aspirin 325 MG tablet, , Disp: , Rfl:  .  carvedilol (COREG) 12.5 MG tablet, CARVEDILOL, 12.5MG  (Oral Tablet)  1 Every Day for 0 days  Quantity: 0.00;  Refills: 0   Ordered :16-Jan-2010  Haith, Tammy ;  Started 07-Jan-2009 Active, Disp: , Rfl:  .  ezetimibe (ZETIA) 10 MG tablet, TAKE 1 TABLET (10 MG TOTAL) BY  MOUTH ONCE DAILY., Disp: , Rfl: 5 .  lisinopril (PRINIVIL,ZESTRIL) 40 MG tablet, Take 40 mg by mouth. , Disp: , Rfl:  .  PROAIR HFA 108 (90 BASE) MCG/ACT inhaler, TAKE 1-2 PUFFS BY MOUTH 4 TIMES A DAY AS NEEDED, Disp: 8.5 Inhaler, Rfl: 3 .  rosuvastatin (CRESTOR) 10 MG tablet, Take 1 tablet by mouth daily., Disp: , Rfl:  .  traZODone (DESYREL) 50 MG tablet, Take 0.5-1 tablets (25-50 mg total) by mouth at bedtime as needed for sleep., Disp: 30 tablet, Rfl: 3 .  triamterene-hydrochlorothiazide (MAXZIDE-25) 37.5-25 MG tablet, 1 tablet daily, Disp: , Rfl:    Patient Care Team: Margo Common, PA as PCP - General (Family Medicine)       Objective:   Vitals: BP (!) 142/108 (BP Location: Right Arm, Patient Position: Sitting, Cuff Size: Normal)   Pulse 65   Temp 97.9 F (36.6 C) (Oral)   Resp 14   Ht 5\' 10"  (1.778 m)   Wt 196 lb 6.4 oz (89.1 kg)   SpO2 98%   BMI 28.18 kg/m    Physical Exam  Constitutional: He is oriented to person, place, and time. He appears well-developed and well-nourished.  HENT:  Head: Normocephalic and atraumatic.  Right Ear: External ear normal.  Left Ear: External ear normal.  Nose: Nose normal.  Mouth/Throat: Oropharynx is clear and moist.  Eyes: Conjunctivae and EOM are normal. Pupils are equal, round, and reactive to light. Right eye exhibits no discharge.  Neck: Normal range of motion. Neck supple. No tracheal deviation present. No thyromegaly present.  Cardiovascular: Normal rate, regular rhythm, normal heart sounds and intact distal pulses.   No murmur heard. CABG in 2009.  Pulmonary/Chest: Effort normal and breath sounds normal. No respiratory distress. He has no wheezes. He has no rales. He exhibits no tenderness.  Abdominal: Soft. He exhibits no distension and no mass. There is no tenderness. There is no rebound and no guarding.  Genitourinary: Rectum normal, prostate normal and penis normal. Rectal exam shows guaiac negative stool.  Musculoskeletal: Normal range of motion. He exhibits no edema or tenderness.  Lymphadenopathy:    He has no cervical adenopathy.  Neurological: He is alert and oriented to person, place, and time. He has normal reflexes. No cranial nerve deficit. He exhibits normal muscle tone. Coordination normal.  Skin: Skin is warm and dry. No rash noted. No erythema.  Psychiatric: He has a normal mood and affect. His behavior is normal. Judgment and thought content normal.    Depression Screen PHQ 2/9 Scores 03/10/2015  PHQ - 2 Score 2  PHQ- 9 Score 4    Assessment & Plan:     Routine Health Maintenance and Physical Exam  Exercise Activities and  Dietary recommendations Goals    Continue light exercise as cardiovascular disease allows per cardiologist's instructions.      Immunization History  Administered Date(s) Administered  . Influenza Split 12/27/2011, 12/08/2012  . Influenza,inj,Quad PF,36+ Mos 11/14/2013  . Pneumococcal Polysaccharide-23 02/15/2012  . Tdap 02/15/2012  . Zoster 02/15/2012    Health Maintenance  Topic Date Due  . Hepatitis C Screening  1950/04/12  . HIV Screening  08/01/1966  . INFLUENZA VACCINE  09/02/2015  . TETANUS/TDAP  02/14/2022  . COLONOSCOPY  07/28/2023  . ZOSTAVAX  Completed     Discussed health benefits of physical activity, and encouraged him to engage in regular exercise appropriate for his age and condition.    --------------------------------------------------------------------  1. Annual physical exam General exam  good. Immunizations and colonoscopy up to date. Given anticipatory counseling and will check routine labs. Good vision and gross hearing screening. 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  - POCT Urinalysis Dipstick  2. Chronic systolic heart failure (HCC) History of NYHA Class II chronic systolic CHF with EF [id number]. Presently on Lisinopril and Coreg daily. Followed by Dr. Nehemiah Massed (cardiologist) annually. No complaints of chest pain or palpitations. No edema or dyspnea of significance. - CBC with Differential/Platelet - Comprehensive metabolic panel - TSH  3. Benign essential HTN  Presently on Lisinopril 40 mga qd and Coreg 12.5 mg qd. Dr. Nehemiah Massed stopped the HCTZ with signs of CKD. Will check CBC, TSH and CMP. BP elevated today. Feels it is due to not taking medication this morning and a bit of anxiousness. Will stop by for recheck in a few days. - CBC with Differential/Platelet - Comprehensive metabolic panel - TSH  4. Coronary atherosclerosis of autologous vein bypass graft without angina MI with CABG in 2009.  Followed annually by cardiologist (Dr. Nehemiah Massed).  5. Mixed hyperlipidemia Tolerating Crestor 10 mg qd with Zetia 10 mg qd. Continue low fat diet and recheck labs. - Comprehensive metabolic panel - Lipid panel - TSH  6. CKD (chronic kidney disease) stage 3, GFR 30-59 ml/min Diagnosed by Dr. Nehemiah Massed (cardiologist) 01-06-16. Will get follow up labs. - CBC with Differential/Platelet - Comprehensive metabolic panel  7. Need for hepatitis C screening test - Hepatitis C antibody  8. Screening for HIV (human immunodeficiency virus) - HIV antibody  9. Screening PSA (prostate specific antigen) - PSA  [name], PA  [facility]

## 2016-03-12 ENCOUNTER — Telehealth: Payer: Self-pay

## 2016-03-12 LAB — CBC WITH DIFFERENTIAL/PLATELET
BASOS: 1 %
Basophils Absolute: 0.1 10*3/uL (ref 0.0–0.2)
EOS (ABSOLUTE): 0.5 10*3/uL — ABNORMAL HIGH (ref 0.0–0.4)
EOS: 7 %
HEMATOCRIT: 44.3 % (ref 37.5–51.0)
Hemoglobin: 15.5 g/dL (ref 13.0–17.7)
Immature Grans (Abs): 0 10*3/uL (ref 0.0–0.1)
Immature Granulocytes: 0 %
LYMPHS ABS: 1.6 10*3/uL (ref 0.7–3.1)
Lymphs: 21 %
MCH: 31.9 pg (ref 26.6–33.0)
MCHC: 35 g/dL (ref 31.5–35.7)
MCV: 91 fL (ref 79–97)
Monocytes Absolute: 0.7 10*3/uL (ref 0.1–0.9)
Monocytes: 9 %
Neutrophils Absolute: 4.6 10*3/uL (ref 1.4–7.0)
Neutrophils: 62 %
Platelets: 207 10*3/uL (ref 150–379)
RBC: 4.86 x10E6/uL (ref 4.14–5.80)
RDW: 13.1 % (ref 12.3–15.4)
WBC: 7.5 10*3/uL (ref 3.4–10.8)

## 2016-03-12 LAB — COMPREHENSIVE METABOLIC PANEL
A/G RATIO: 2.4 — AB (ref 1.2–2.2)
ALK PHOS: 100 IU/L (ref 39–117)
ALT: 51 IU/L — ABNORMAL HIGH (ref 0–44)
AST: 38 IU/L (ref 0–40)
Albumin: 4.8 g/dL (ref 3.6–4.8)
BILIRUBIN TOTAL: 1.9 mg/dL — AB (ref 0.0–1.2)
BUN/Creatinine Ratio: 14 (ref 10–24)
BUN: 17 mg/dL (ref 8–27)
CO2: 27 mmol/L (ref 18–29)
Calcium: 9.3 mg/dL (ref 8.6–10.2)
Chloride: 100 mmol/L (ref 96–106)
Creatinine, Ser: 1.19 mg/dL (ref 0.76–1.27)
GFR calc Af Amer: 74 mL/min/{1.73_m2} (ref >59)
GFR calc non Af Amer: 64 mL/min/{1.73_m2} (ref >59)
GLOBULIN, TOTAL: 2 g/dL (ref 1.5–4.5)
Glucose: 96 mg/dL (ref 65–99)
POTASSIUM: 4.2 mmol/L (ref 3.5–5.2)
Sodium: 142 mmol/L (ref 134–144)
Total Protein: 6.8 g/dL (ref 6.0–8.5)

## 2016-03-12 LAB — HIV ANTIBODY (ROUTINE TESTING W REFLEX): HIV SCREEN 4TH GENERATION: NONREACTIVE

## 2016-03-12 LAB — LIPID PANEL
Chol/HDL Ratio: 3.3 ratio units (ref 0.0–5.0)
Cholesterol, Total: 148 mg/dL (ref 100–199)
HDL: 45 mg/dL (ref >39)
LDL Calculated: 81 mg/dL (ref 0–99)
Triglycerides: 108 mg/dL (ref 0–149)
VLDL Cholesterol Cal: 22 mg/dL (ref 5–40)

## 2016-03-12 LAB — PSA: PROSTATE SPECIFIC AG, SERUM: 1.2 ng/mL (ref 0.0–4.0)

## 2016-03-12 LAB — TSH: TSH: 1.9 u[IU]/mL (ref 0.450–4.500)

## 2016-03-12 LAB — HEPATITIS C ANTIBODY

## 2016-03-12 NOTE — Telephone Encounter (Signed)
-----   Message from Margo Common, [place] sent at 03/12/2016  8:12 AM EST ----- All blood tests normal except slight variation of bilirubin and ALT (indicates a slight liver stress). Drinking extra water should get this back to absolute normal. Would recheck levels in 3 months to be sure no further changes in the wrong direction. Recheck BP in 1-2 weeks after getting back on the Triamterene-Hydrochlorothiazide. Kidney function went back to normal off this for a while. May need to change this BP med if pressure no better.

## 2016-03-12 NOTE — Telephone Encounter (Signed)
Advised pt of lab results. Pt verbally acknowledges understanding. Emily Drozdowski, CMA   

## 2016-03-12 NOTE — Telephone Encounter (Signed)
lmtcb Joseph Cooper, CMA  

## 2016-03-31 ENCOUNTER — Telehealth: Payer: Self-pay | Admitting: Family Medicine

## 2016-03-31 NOTE — Telephone Encounter (Signed)
Pt needs refill on his  PROAIR HFA 108 (90 BASE) MCG/ACT inhaler  [facility] [address]  Thanks Con Memos

## 2016-04-01 ENCOUNTER — Other Ambulatory Visit: Payer: Self-pay | Admitting: Family Medicine

## 2016-04-02 ENCOUNTER — Other Ambulatory Visit: Payer: Self-pay | Admitting: Family Medicine

## 2016-04-02 DIAGNOSIS — J4520 Mild intermittent asthma, uncomplicated: Secondary | ICD-10-CM

## 2016-04-02 MED ORDER — ALBUTEROL SULFATE HFA 108 (90 BASE) MCG/ACT IN AERS: INHALATION_SPRAY | RESPIRATORY_TRACT | 3 refills | Status: DC

## 2016-04-02 NOTE — Telephone Encounter (Signed)
Patient came in the office today to request medication be sent to [facility] today if possible.

## 2016-05-21 ENCOUNTER — Encounter: Payer: Self-pay | Admitting: Family Medicine

## 2016-05-21 ENCOUNTER — Ambulatory Visit (INDEPENDENT_AMBULATORY_CARE_PROVIDER_SITE_OTHER): Payer: BLUE CROSS/BLUE SHIELD | Admitting: Family Medicine

## 2016-05-21 VITALS — BP 136/92 | HR 68 | Temp 97.7°F | Resp 20 | Wt 196.0 lb

## 2016-05-21 DIAGNOSIS — J45901 Unspecified asthma with (acute) exacerbation: Secondary | ICD-10-CM

## 2016-05-21 MED ORDER — BUDESONIDE-FORMOTEROL FUMARATE 80-4.5 MCG/ACT IN AERO: 2.0000 | INHALATION_SPRAY | Freq: Two times a day (BID) | RESPIRATORY_TRACT | 0 refills | Status: DC

## 2016-05-21 MED ORDER — DOXYCYCLINE HYCLATE 100 MG PO TABS: 100.0000 mg | ORAL_TABLET | Freq: Two times a day (BID) | ORAL | 0 refills | Status: DC

## 2016-05-21 NOTE — Patient Instructions (Signed)
Bronchospasm, Adult Bronchospasm is a tightening of the airways going into the lungs. During an episode, it may be harder to breathe. You may cough, and you may make a whistling sound when you breathe (wheeze). This condition often affects people with asthma. What are the causes? This condition is caused by swelling and irritation in the airways. It can be triggered by:  An infection (common).  Seasonal allergies.  An allergic reaction.  Exercise.  Irritants. These include pollution, cigarette smoke, strong odors, aerosol sprays, and paint fumes.  Weather changes. Winds increase molds and pollens in the air. Cold air may cause swelling.  Stress and emotional upset.  What are the signs or symptoms? Symptoms of this condition include:  Wheezing. If the episode was triggered by an allergy, wheezing may start right away or hours later.  Nighttime coughing.  Frequent or severe coughing with a simple cold.  Chest tightness.  Shortness of breath.  Decreased ability to exercise.  How is this diagnosed? This condition is usually diagnosed with a review of your medical history and a physical exam. Tests, such as lung function tests, are sometimes done to look for other conditions. The need for a chest X-ray depends on where the wheezing occurs and whether it is the first time you have wheezed. How is this treated? This condition may be treated with:  Inhaled medicines. These open up the airways and help you breathe. They can be taken with an inhaler or a nebulizer device.  Corticosteroid medicines. These may be given for severe bronchospasm, usually when it is associated with asthma.  Avoiding triggers, such as irritants, infection, or allergies.  Follow these instructions at home: Medicines  Take over-the-counter and prescription medicines only as told by your health care provider.  If you need to use an inhaler or nebulizer to take your medicine, ask your health care  provider to explain how to use it correctly. If you were given a spacer, always use it with your inhaler. Lifestyle  Reduce the number of triggers in your home. To do this: ? Change your heating and air conditioning filter at least once a month. ? Limit your use of fireplaces and wood stoves. ? Do not smoke. Do not allow smoking in your home. ? Avoid using perfumes and fragrances. ? Get rid of pests, such as roaches and mice, and their droppings. ? Remove any mold from your home. ? Keep your house clean and dust free. Use unscented cleaning products. ? Replace carpet with wood, tile, or vinyl flooring. Carpet can trap dander and dust. ? Use allergy-proof pillows, mattress covers, and box spring covers. ? Wash bed sheets and blankets every week in hot water. Dry them in a dryer. ? Use blankets that are made of polyester or cotton. ? Wash your hands often. ? Do not allow pets in your bedroom.  Avoid breathing in cold air when you exercise. General instructions  Have a plan for seeking medical care. Know when to call your health care provider and local emergency services, and where to get emergency care.  Stay up to date on your immunizations.  When you have an episode of bronchospasm, stay calm. Try to relax and breathe more slowly.  If you have asthma, make sure you have an asthma action plan.  Keep all follow-up visits as told by your health care provider. This is important. Contact a health care provider if:  You have muscle aches.  You have chest pain.  The mucus that you   cough up (sputum) changes from clear or white to yellow, green, gray, or bloody.  You have a fever.  Your sputum gets thicker. Get help right away if:  Your wheezing and coughing get worse, even after you take your prescribed medicines.  It gets even harder to breathe.  You develop severe chest pain. Summary  Bronchospasm is a tightening of the airways going into the lungs.  During an episode of  bronchospasm, you may have a harder time breathing. You may cough and make a whistling sound when you breathe (wheeze).  Avoid exposure to triggers such as smoke, dust, mold, animal dander, and fragrances.  When you have an episode of bronchospasm, stay calm. Try to relax and breathe more slowly. This information is not intended to replace advice given to you by your health care provider. Make sure you discuss any questions you have with your health care provider. Document Released: 01/21/2003 Document Revised: 01/15/2016 Document Reviewed: 01/15/2016 Elsevier Interactive Patient Education  2017 Elsevier Inc.  

## 2016-05-21 NOTE — Progress Notes (Signed)
Patient: Joseph Cooper Male    DOB: [birth year]   66 y.o.   MRN: [id number] Visit Date: 05/21/2016  Today's Provider: [name], PA   Chief Complaint  Patient presents with  . Cough   Subjective:    Cough  This is a new problem. The current episode started in the past 7 days (x 5 days). The problem has been gradually worsening. The cough is productive of sputum (green). Associated symptoms include ear congestion, shortness of breath and wheezing. Pertinent negatives include no chest pain, chills, ear pain, fever, headaches, hemoptysis, myalgias, nasal congestion, postnasal drip, rhinorrhea, sore throat, sweats or weight loss. Nothing aggravates the symptoms. Treatments tried: Copywriter, advertising and [employer]. The treatment provided mild relief. His past medical history is significant for asthma.   Patient Active Problem List   Diagnosis Date Noted  . CKD (chronic kidney disease) stage 3, GFR 30-59 ml/min 01/06/2016  . Benign neoplasm of colon 03/04/2015  . ED (erectile dysfunction) of organic origin 03/04/2015  . HLD (hyperlipidemia) 03/04/2015  . Asthma, mild intermittent 03/04/2015  . Chronic systolic heart failure ([place]) 12/07/2013  . Arteriosclerosis of autologous vein coronary artery bypass graft 12/07/2013  . Benign essential HTN 01/07/2009  . Asthma, exogenous 02/26/2008  . CAD in native artery 01/05/2008   Past Surgical History:  Procedure Laterality Date  . CORONARY ARTERY BYPASS GRAFT    . TONSILLECTOMY AND ADENOIDECTOMY    . VASECTOMY     Family History  Problem Relation Age of Onset  . Healthy Mother   . Stroke Father   . CAD Father   . Heart disease Father   . Diabetes Sister   . Heart attack Sister   . Healthy Daughter   . Heart attack Maternal Grandmother   . Heart attack Maternal Grandfather   . Heart attack Paternal Grandfather   . Healthy Daughter       No Known Allergies   Current Outpatient Prescriptions:  .  albuterol (PROAIR HFA) 108  (90 Base) MCG/ACT inhaler, TAKE 1-2 PUFFS BY MOUTH 4 TIMES A DAY AS NEEDED, Disp: 8.5 Inhaler, Rfl: 3 .  aspirin 325 MG tablet, , Disp: , Rfl:  .  carvedilol (COREG) 12.5 MG tablet, CARVEDILOL, 12.5MG  (Oral Tablet)  1 Every Day for 0 days  Quantity: 0.00;  Refills: 0   Ordered :16-Jan-2010  Haith, Tammy ;  Started 07-Jan-2009 Active, Disp: , Rfl:  .  lisinopril (PRINIVIL,ZESTRIL) 40 MG tablet, Take 40 mg by mouth. , Disp: , Rfl:  .  rosuvastatin (CRESTOR) 10 MG tablet, Take 1 tablet by mouth daily., Disp: , Rfl:  .  triamterene-hydrochlorothiazide (MAXZIDE-25) 37.5-25 MG tablet, 1 tablet daily, Disp: , Rfl:  .  traZODone (DESYREL) 50 MG tablet, Take 0.5-1 tablets (25-50 mg total) by mouth at bedtime as needed for sleep. (Patient not taking: Reported on 05/21/2016), Disp: 30 tablet, Rfl: 3  Review of Systems  Constitutional: Negative for chills, fever and weight loss.  HENT: Negative for ear pain, postnasal drip, rhinorrhea and sore throat.   Respiratory: Positive for cough, shortness of breath and wheezing. Negative for hemoptysis.   Cardiovascular: Negative for chest pain.  Musculoskeletal: Negative for myalgias.  Neurological: Negative for headaches.    Social History  Substance Use Topics  . Smoking status: Never Smoker  . Smokeless tobacco: Never Used  . Alcohol use 0.0 oz/week     Comment: occasionally    Objective:   BP (!) 136/92 (BP Location:  Right Arm, Patient Position: Sitting, Cuff Size: Large)   Pulse 68   Temp 97.7 F (36.5 C) (Oral)   Resp 20   Wt 196 lb (88.9 kg)   SpO2 95%   BMI 28.12 kg/m  Vitals:   05/21/16 0805  BP: (!) 136/92  Pulse: 68  Resp: 20  Temp: 97.7 F (36.5 C)  TempSrc: Oral  SpO2: 95%  Weight: 196 lb (88.9 kg)     Physical Exam  Constitutional: He is oriented to person, place, and time. He appears well-developed and well-nourished. No distress.  HENT:  Head: Normocephalic and atraumatic.  Right Ear: Hearing normal.  Left Ear: Hearing  normal.  Nose: Nose normal.  Eyes: Conjunctivae and lids are normal. Right eye exhibits no discharge. Left eye exhibits no discharge. No scleral icterus.  Neck: Neck supple.  Cardiovascular: Normal rate and regular rhythm.   Pulmonary/Chest: Effort normal. No respiratory distress. He has wheezes.  Few rhonchi and tight wheezes.  Musculoskeletal: Normal range of motion.  Neurological: He is alert and oriented to person, place, and time.  Skin: Skin is intact. No lesion and no rash noted.  Psychiatric: He has a normal mood and affect. His speech is normal and behavior is normal. Thought content normal.      Assessment & Plan:     1. Asthmatic bronchitis with acute exacerbation, unspecified asthma severity, unspecified whether persistent Onset over the past 5 days after carrying dusty boxes. No significant sputum production but wheezing and rhonchi noted. Given Duoneb by nebulizer in the office and wheezing improved. May use Symbicort and Albuterol. Add Doxycycline and may use Mucinex-DM. Increase fluid intake and recheck in 5-7 days if no better or sooner if another nebulizer treatment needed. - budesonide-formoterol (SYMBICORT) 80-4.5 MCG/ACT inhaler; Inhale 2 puffs into the lungs 2 (two) times daily.  Dispense: 1 Inhaler; Refill: 0 - doxycycline (VIBRA-TABS) 100 MG tablet; Take 1 tablet (100 mg total) by mouth 2 (two) times daily.  Dispense: 20 tablet; Refill: 0      Patient seen and examined by [name], PA, and note scribed by Renaldo Fiddler, CMA.  [name], PA  [facility]

## 2016-07-02 DIAGNOSIS — Z86010 Personal history of colonic polyps: Secondary | ICD-10-CM | POA: Insufficient documentation

## 2016-11-03 ENCOUNTER — Ambulatory Visit
Admission: RE | Admit: 2016-11-03 | Discharge: 2016-11-03 | Disposition: A | Discharge status: Home / Self Care | Payer: BLUE CROSS/BLUE SHIELD | Source: Ambulatory Visit | Attending: Unknown Physician Specialty | Admitting: Unknown Physician Specialty

## 2016-11-03 ENCOUNTER — Ambulatory Visit: Payer: BLUE CROSS/BLUE SHIELD | Admitting: Anesthesiology

## 2016-11-03 ENCOUNTER — Encounter: Payer: Self-pay | Admitting: *Deleted

## 2016-11-03 ENCOUNTER — Encounter
Admission: RE | Disposition: A | Discharge status: Home / Self Care | Payer: Self-pay | Source: Ambulatory Visit | Attending: Unknown Physician Specialty

## 2016-11-03 DIAGNOSIS — K5730 Diverticulosis of large intestine without perforation or abscess without bleeding: Secondary | ICD-10-CM | POA: Insufficient documentation

## 2016-11-03 DIAGNOSIS — I10 Essential (primary) hypertension: Secondary | ICD-10-CM | POA: Insufficient documentation

## 2016-11-03 DIAGNOSIS — K640 First degree hemorrhoids: Secondary | ICD-10-CM | POA: Insufficient documentation

## 2016-11-03 DIAGNOSIS — E785 Hyperlipidemia, unspecified: Secondary | ICD-10-CM | POA: Insufficient documentation

## 2016-11-03 DIAGNOSIS — Z7982 Long term (current) use of aspirin: Secondary | ICD-10-CM | POA: Diagnosis not present

## 2016-11-03 DIAGNOSIS — I252 Old myocardial infarction: Secondary | ICD-10-CM | POA: Diagnosis not present

## 2016-11-03 DIAGNOSIS — Z79899 Other long term (current) drug therapy: Secondary | ICD-10-CM | POA: Diagnosis not present

## 2016-11-03 DIAGNOSIS — I2510 Atherosclerotic heart disease of native coronary artery without angina pectoris: Secondary | ICD-10-CM | POA: Diagnosis not present

## 2016-11-03 DIAGNOSIS — Z1211 Encounter for screening for malignant neoplasm of colon: Secondary | ICD-10-CM | POA: Insufficient documentation

## 2016-11-03 DIAGNOSIS — J45909 Unspecified asthma, uncomplicated: Secondary | ICD-10-CM | POA: Diagnosis not present

## 2016-11-03 DIAGNOSIS — Z86010 Personal history of colonic polyps: Secondary | ICD-10-CM | POA: Insufficient documentation

## 2016-11-03 HISTORY — DX: Essential (primary) hypertension: I10

## 2016-11-03 HISTORY — DX: Hyperlipidemia, unspecified: E78.5

## 2016-11-03 HISTORY — PX: COLONOSCOPY WITH PROPOFOL: SHX5780

## 2016-11-03 SURGERY — COLONOSCOPY WITH PROPOFOL
Anesthesia: General

## 2016-11-03 MED ORDER — PROPOFOL 500 MG/50ML IV EMUL
INTRAVENOUS | Status: AC
  Filled 2016-11-03: qty 50

## 2016-11-03 MED ORDER — SODIUM CHLORIDE 0.9 % IV SOLN: INTRAVENOUS | Status: DC

## 2016-11-03 MED ORDER — SODIUM CHLORIDE 0.9 % IV SOLN
INTRAVENOUS | Status: DC
  Administered 2016-11-03: 11:00:00 via INTRAVENOUS

## 2016-11-03 MED ORDER — PROPOFOL 10 MG/ML IV BOLUS
INTRAVENOUS | Status: DC | PRN
  Administered 2016-11-03: 40 mg via INTRAVENOUS
  Administered 2016-11-03: 20 mg via INTRAVENOUS

## 2016-11-03 MED ORDER — EPHEDRINE SULFATE 50 MG/ML IJ SOLN
INTRAMUSCULAR | Status: AC
  Filled 2016-11-03: qty 1

## 2016-11-03 MED ORDER — EPHEDRINE SULFATE 50 MG/ML IJ SOLN
INTRAMUSCULAR | Status: DC | PRN
Start: 2016-11-03 — End: 2016-11-03
  Administered 2016-11-03: 10 mg via INTRAVENOUS

## 2016-11-03 MED ORDER — PROPOFOL 500 MG/50ML IV EMUL
INTRAVENOUS | Status: DC | PRN
  Administered 2016-11-03: 120 ug/kg/min via INTRAVENOUS

## 2016-11-03 NOTE — Anesthesia Postprocedure Evaluation (Signed)
Anesthesia Post Note  Patient: Joseph Cooper  Procedure(s) Performed: COLONOSCOPY WITH PROPOFOL (N/A )  Patient location during evaluation: PACU Anesthesia Type: General Level of consciousness: awake Pain management: pain level controlled Vital Signs Assessment: post-procedure vital signs reviewed and stable Respiratory status: nonlabored ventilation Cardiovascular status: stable Anesthetic complications: no     Last Vitals:  Vitals:   11/03/16 1133 11/03/16 1143  BP: (!) 177/66 126/79  Pulse: 62 60  Resp: 15 13  Temp:    SpO2: 97% 99%    Last Pain:  Vitals:   11/03/16 1113  TempSrc: Tympanic                 VAN STAVEREN,Jaeleen Inzunza

## 2016-11-03 NOTE — Anesthesia Preprocedure Evaluation (Signed)
Anesthesia Evaluation  Patient identified by MRN, date of birth, ID band Patient awake    Reviewed: Allergy & Precautions, NPO status , Patient's Chart, lab work & pertinent test results  Airway Mallampati: II       Dental  (+) Teeth Intact   Pulmonary asthma ,    breath sounds clear to auscultation       Cardiovascular Exercise Tolerance: Good hypertension, Pt. on medications + CAD and + Past MI   Rhythm:Regular Rate:Normal     Neuro/Psych negative neurological ROS     GI/Hepatic negative GI ROS, Neg liver ROS,   Endo/Other  negative endocrine ROS  Renal/GU      Musculoskeletal negative musculoskeletal ROS (+)   Abdominal Normal abdominal exam  (+)   Peds negative pediatric ROS (+)  Hematology negative hematology ROS (+)   Anesthesia Other Findings   Reproductive/Obstetrics                             Anesthesia Physical Anesthesia Plan  ASA: II  Anesthesia Plan: General   Post-op Pain Management:    Induction: Intravenous  PONV Risk Score and Plan: 0  Airway Management Planned: Natural Airway and Nasal Cannula  Additional Equipment:   Intra-op Plan:   Post-operative Plan:   Informed Consent: I have reviewed the patients History and Physical, chart, labs and discussed the procedure including the risks, benefits and alternatives for the proposed anesthesia with the patient or authorized representative who has indicated his/her understanding and acceptance.     Plan Discussed with: Surgeon  Anesthesia Plan Comments:         Anesthesia Quick Evaluation

## 2016-11-03 NOTE — Transfer of Care (Signed)
Immediate Anesthesia Transfer of Care Note  Patient: Joseph Cooper  Procedure(s) Performed: COLONOSCOPY WITH PROPOFOL (N/A )  Patient Location: PACU  Anesthesia Type:General  Level of Consciousness: awake  Airway & Oxygen Therapy: Patient Spontanous Breathing and Patient connected to nasal cannula oxygen  Post-op Assessment: Report given to RN and Post -op Vital signs reviewed and stable  Post vital signs: Reviewed  Last Vitals:  Vitals:   11/03/16 1019  BP: (!) 106/91  Pulse: 78  Resp: 18  Temp: (!) 36.2 C  SpO2: 99%    Last Pain:  Vitals:   11/03/16 1019  TempSrc: Tympanic         Complications: No apparent anesthesia complications

## 2016-11-03 NOTE — H&P (Signed)
Primary Care Physician:  Margo Common, PA Primary Gastroenterologist:  Dr. Vira Agar  Pre-Procedure History & Physical: HPI:  Joseph Cooper is a 66 y.o. male is here for an colonoscopy.   Past Medical History:  Diagnosis Date  . CAD (coronary artery disease)   . Hyperlipidemia   . Hypertension     Past Surgical History:  Procedure Laterality Date  . CORONARY ARTERY BYPASS GRAFT    . TONSILLECTOMY AND ADENOIDECTOMY    . VASECTOMY      Prior to Admission medications   Medication Sig Start Date End Date Taking? Authorizing Provider  albuterol (PROAIR HFA) 108 (90 Base) MCG/ACT inhaler TAKE 1-2 PUFFS BY MOUTH 4 TIMES A DAY AS NEEDED 04/02/16  Yes Chrismon, Vickki Muff, PA  aspirin 325 MG tablet  01/07/09  Yes [provider]  carvedilol (COREG) 12.5 MG tablet CARVEDILOL, 12.5MG  (Oral Tablet)  1 Every Day for 0 days  Quantity: 0.00;  Refills: 0   Ordered :16-Jan-2010  Abran Richard ;  Started 07-Jan-2009 Active 01/07/09  Yes [provider]  lisinopril (PRINIVIL,ZESTRIL) 40 MG tablet Take 40 mg by mouth.  01/07/09  Yes [provider]  rosuvastatin (CRESTOR) 10 MG tablet Take 1 tablet by mouth daily. 09/24/15 11/03/16 Yes [provider]  triamterene-hydrochlorothiazide (MAXZIDE-25) 37.5-25 MG tablet 1 tablet daily 01/07/09  Yes [provider]  budesonide-formoterol (SYMBICORT) 80-4.5 MCG/ACT inhaler Inhale 2 puffs into the lungs 2 (two) times daily. Patient not taking: Reported on 11/03/2016 05/21/16   Chrismon, Vickki Muff, PA  doxycycline (VIBRA-TABS) 100 MG tablet Take 1 tablet (100 mg total) by mouth 2 (two) times daily. Patient not taking: Reported on 11/03/2016 05/21/16   Chrismon, Vickki Muff, PA  traZODone (DESYREL) 50 MG tablet Take 0.5-1 tablets (25-50 mg total) by mouth at bedtime as needed for sleep. Patient not taking: Reported on 05/21/2016 03/10/15   ChrismonVickki Muff, PA    Allergies as of 08/26/2016  . (No Known Allergies)     Family History  Problem Relation Age of Onset  . Healthy Mother   . Stroke Father   . CAD Father   . Heart disease Father   . Diabetes Sister   . Heart attack Sister   . Healthy Daughter   . Heart attack Maternal Grandmother   . Heart attack Maternal Grandfather   . Heart attack Paternal Grandfather   . Healthy Daughter     Social History   Social History  . Marital status: Married    Spouse name: N/A  . Number of children: N/A  . Years of education: N/A   Occupational History  . Not on file.   Social History Main Topics  . Smoking status: Never Smoker  . Smokeless tobacco: Never Used  . Alcohol use 0.0 oz/week     Comment: occasionally   . Drug use: No  . Sexual activity: Not on file   Other Topics Concern  . Not on file   Social History Narrative  . No narrative on file    Review of Systems: See HPI, otherwise negative ROS  Physical Exam: BP (!) 106/91   Pulse 78   Temp (!) 97.1 F (36.2 C) (Tympanic)   Resp 18   Ht 5\' 10"  (1.778 m)   Wt 85.3 kg (188 lb)   SpO2 99%   BMI 26.98 kg/m  General:   Alert,  pleasant and cooperative in NAD Head:  Normocephalic and atraumatic. Neck:  Supple; no masses or  thyromegaly. Lungs:  Clear throughout to auscultation.    Heart:  Regular rate and rhythm. Abdomen:  Soft, nontender and nondistended. Normal bowel sounds, without guarding, and without rebound.   Neurologic:  Alert and  oriented x4;  grossly normal neurologically.  Impression/Plan: Joseph Cooper is here for an colonoscopy to be performed for [facility] colon polyps.  Risks, benefits, limitations, and alternatives regarding  colonoscopy have been reviewed with the patient.  Questions have been answered.  All parties agreeable.   Gaylyn Cheers, MD  11/03/2016, 10:30 AM

## 2016-11-03 NOTE — Op Note (Signed)
[facility] Patient Name: Joseph Cooper Procedure Date: 11/03/2016 10:32 AM MRN: [id number] Account #: [id number] Date of Birth: 1950/06/10 Admit Type: Outpatient Age: 66 Room: [facility] ENDO ROOM 3 Gender: Male Note Status: Finalized Procedure:            Colonoscopy Indications:          High risk colon cancer surveillance: Personal history                        of colonic polyps Providers:            Manya Silvas, MD Referring MD:         Signe Colt, MD Medicines:            Propofol per Anesthesia Complications:        No immediate complications. Procedure:            Pre-Anesthesia Assessment:                       - After reviewing the risks and benefits, the patient                        was deemed in satisfactory condition to undergo the                        procedure.                       After obtaining informed consent, the colonoscope was                        passed under direct vision. Throughout the procedure,                        the patient's blood pressure, pulse, and oxygen                        saturations were monitored continuously. The                        Colonoscope was introduced through the anus and                        advanced to the the cecum, identified by appendiceal                        orifice and ileocecal valve. The colonoscopy was                        performed without difficulty. The patient tolerated the                        procedure well. The quality of the bowel preparation                        was excellent. Findings:      A diminutive polyp was found in the hepatic flexure. The polyp was       sessile. The polyp was removed with a jumbo cold forceps. Resection and       retrieval were complete.      A diminutive polyp was found in the  hepatic flexure. The polyp was       sessile. The polyp was removed with a cold snare. Resection and       retrieval were complete.  Multiple small-mouthed diverticula were found in the sigmoid colon.      Internal hemorrhoids were found during endoscopy. The hemorrhoids were       small and Grade I (internal hemorrhoids that do not prolapse). Impression:           - One diminutive polyp at the hepatic flexure, removed                        with a jumbo cold forceps. Resected and retrieved.                       - One diminutive polyp at the hepatic flexure, removed                        with a cold snare. Resected and retrieved.                       - Diverticulosis in the sigmoid colon.                       - Internal hemorrhoids. Recommendation:       - Await pathology results. Manya Silvas, MD 11/03/2016 11:12:54 AM This report has been signed electronically. Number of Addenda: 0 Note Initiated On: 11/03/2016 10:32 AM Scope Withdrawal Time: 0 hours 16 minutes 47 seconds  Total Procedure Duration: 0 hours 26 minutes 29 seconds       [facility]

## 2016-11-03 NOTE — Anesthesia Post-op Follow-up Note (Signed)
Anesthesia QCDR form completed.        

## 2016-11-04 ENCOUNTER — Encounter: Payer: Self-pay | Admitting: Unknown Physician Specialty

## 2016-11-04 LAB — SURGICAL PATHOLOGY

## 2016-11-15 ENCOUNTER — Encounter: Payer: Self-pay | Admitting: Family Medicine

## 2016-12-27 ENCOUNTER — Other Ambulatory Visit: Payer: Self-pay | Admitting: Family Medicine

## 2017-03-12 IMAGING — CR DG CHEST 2V
1 series · 2 of 2 positions shown · non-contrast
Comparison: 02/11/2007

CLINICAL DATA: Patient reports near syncope today. Hx HTN, CABG in
1881. Former smoker.

EXAM:
CHEST  2 VIEW

[Series 1: dg chest 2 view · 0.14mm/px · 2 of 2 slices shown]
[im 1/2]
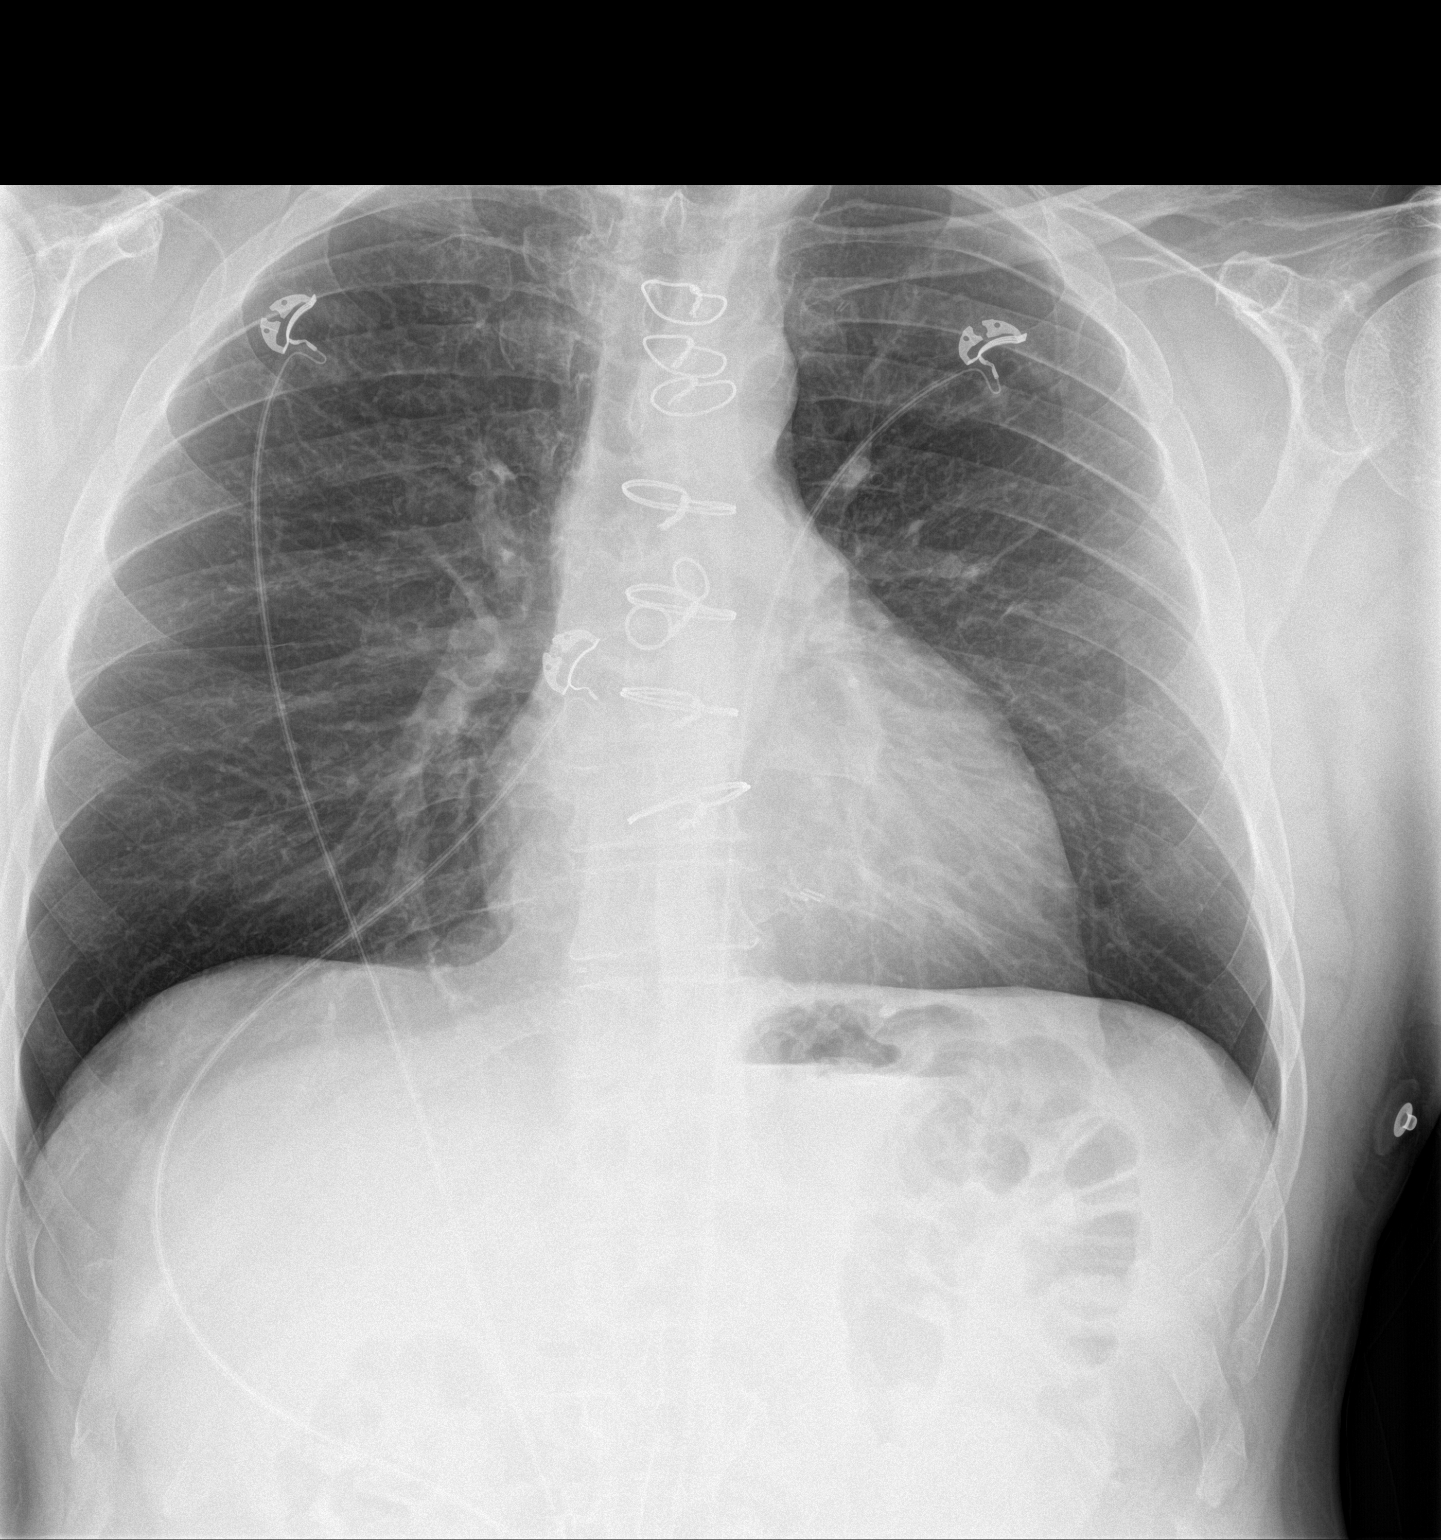
[im 2/2]
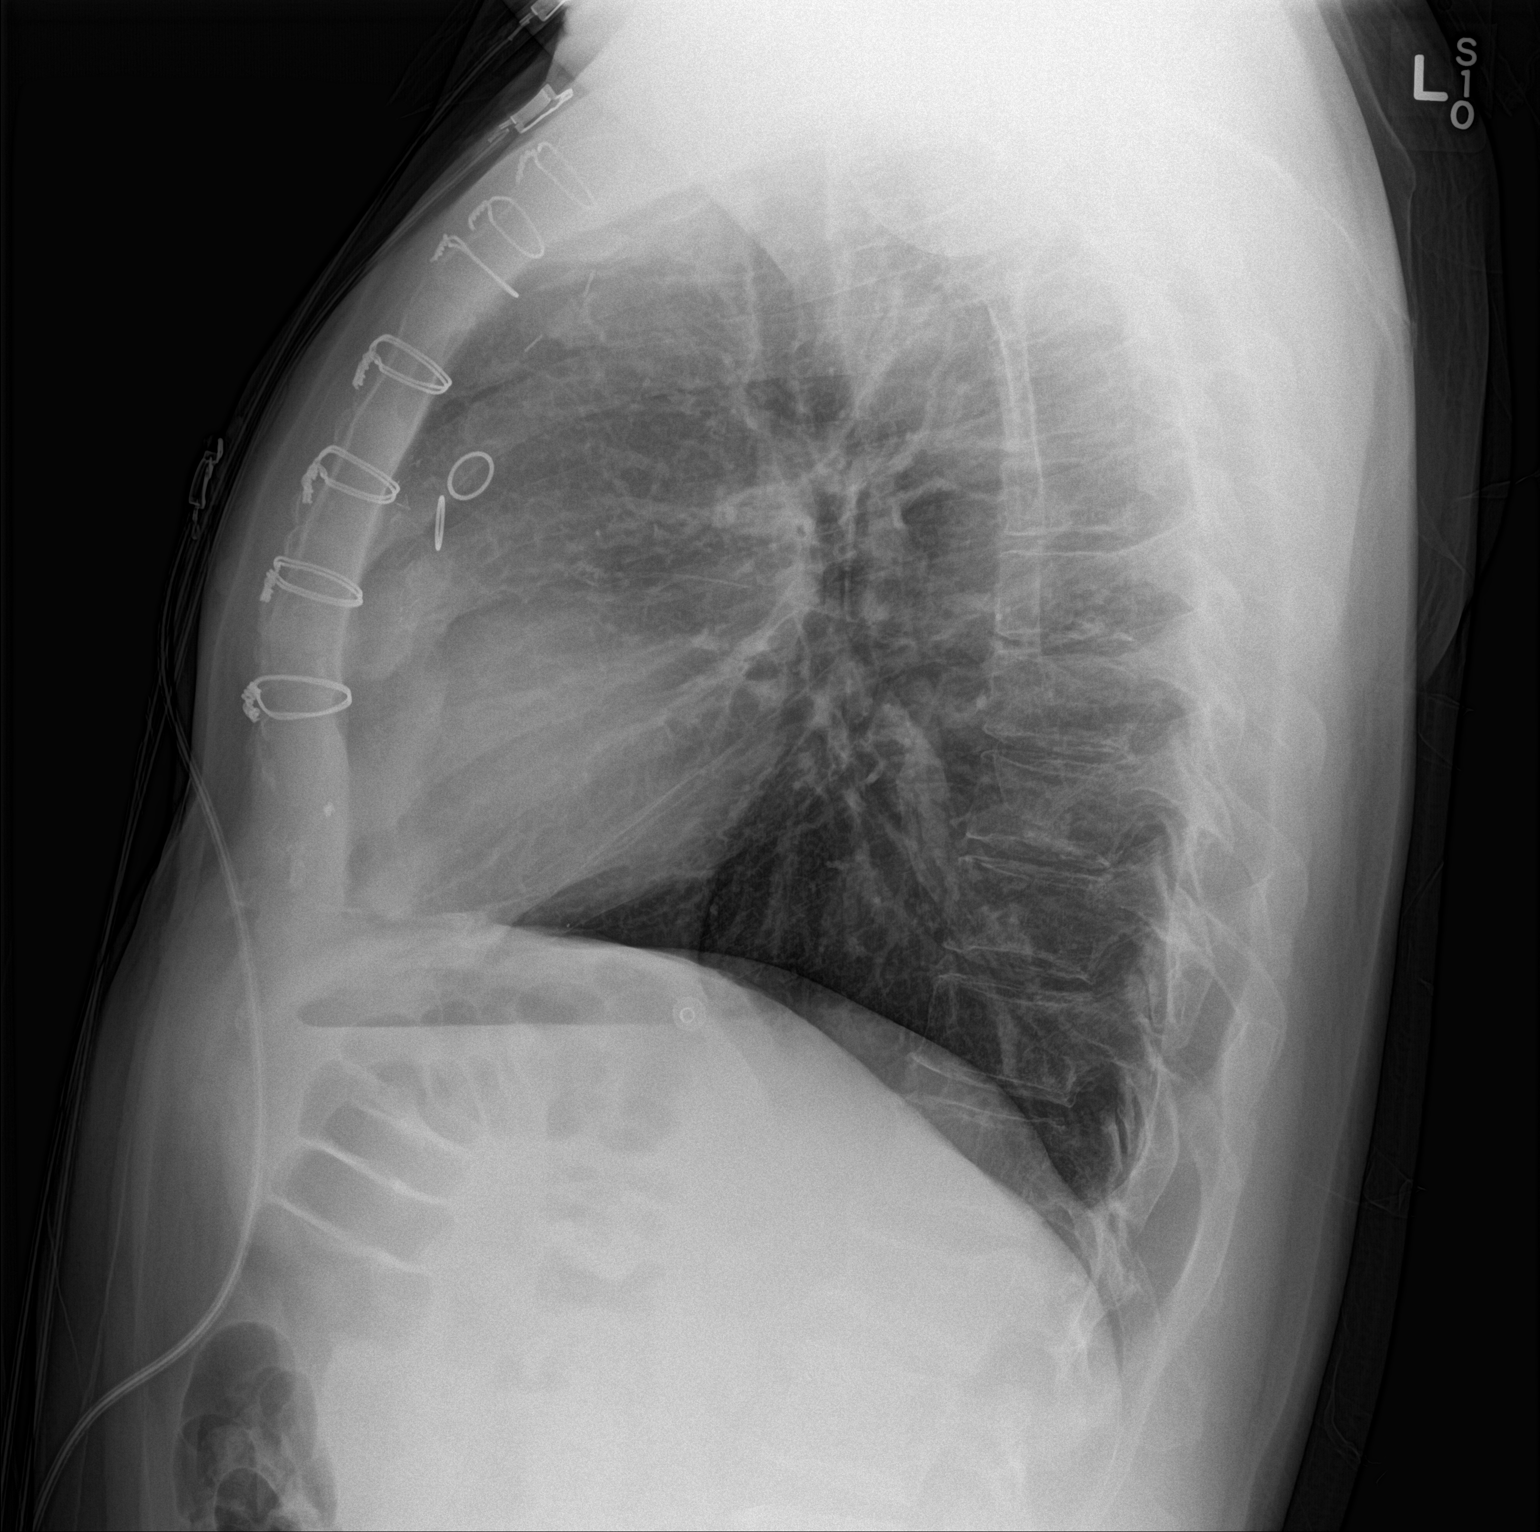

[2 of 2 positions shown; findings below may reference images not displayed]

FINDINGS: Prior median sternotomy. Midline trachea. Borderline cardiomegaly.
No pleural effusion or pneumothorax. No congestive failure. Clear
lungs.
IMPRESSION: Borderline cardiomegaly, without acute disease.

## 2017-03-14 ENCOUNTER — Ambulatory Visit (INDEPENDENT_AMBULATORY_CARE_PROVIDER_SITE_OTHER): Payer: BLUE CROSS/BLUE SHIELD | Admitting: Family Medicine

## 2017-03-14 ENCOUNTER — Encounter: Payer: Self-pay | Admitting: Family Medicine

## 2017-03-14 VITALS — BP 122/82 | HR 80 | Temp 98.2°F | Resp 16 | Ht 70.0 in | Wt 198.8 lb

## 2017-03-14 DIAGNOSIS — N183 Chronic kidney disease, stage 3 unspecified: Secondary | ICD-10-CM

## 2017-03-14 DIAGNOSIS — Z23 Encounter for immunization: Secondary | ICD-10-CM

## 2017-03-14 DIAGNOSIS — Z0000 Encounter for general adult medical examination without abnormal findings: Secondary | ICD-10-CM | POA: Diagnosis not present

## 2017-03-14 DIAGNOSIS — I10 Essential (primary) hypertension: Secondary | ICD-10-CM | POA: Diagnosis not present

## 2017-03-14 DIAGNOSIS — E782 Mixed hyperlipidemia: Secondary | ICD-10-CM | POA: Diagnosis not present

## 2017-03-14 NOTE — Progress Notes (Signed)
Patient: Joseph Cooper, Male    DOB: 24-Sep-1950, 67 y.o.   MRN: [id number] Visit Date: 03/14/2017  Today's Provider: [name], PA   Chief Complaint  Patient presents with  . Medicare Wellness   Subjective:    Annual wellness visit Joseph Cooper is a 67 y.o. male. He feels well. He reports exercising 3 days a week. He reports he is sleeping well. 03/11/16 CPE 11/03/16 Colonoscopy-polyps, diverticulosis, and internal hemorrhoids -----------------------------------------------------------   Review of Systems  Constitutional: Negative.   HENT: Negative.   Eyes: Negative.   Respiratory: Negative.   Cardiovascular: Negative.   Gastrointestinal: Negative.   Endocrine: Negative.   Genitourinary: Negative.   Musculoskeletal: Negative.   Skin: Negative.   Allergic/Immunologic: Negative.   Neurological: Negative.   Hematological: Negative.   Psychiatric/Behavioral: Negative.     Social History   Socioeconomic History  . Marital status: Married    Spouse name: Not on file  . Number of children: Not on file  . Years of education: Not on file  . Highest education level: Not on file  Social Needs  . Financial resource strain: Not on file  . Food insecurity - worry: Not on file  . Food insecurity - inability: Not on file  . Transportation needs - medical: Not on file  . Transportation needs - non-medical: Not on file  Occupational History  . Not on file  Tobacco Use  . Smoking status: Never Smoker  . Smokeless tobacco: Never Used  Substance and Sexual Activity  . Alcohol use: Yes    Alcohol/week: 0.0 oz    Comment: occasionally   . Drug use: No  . Sexual activity: Not on file  Other Topics Concern  . Not on file  Social History Narrative  . Not on file    Past Medical History:  Diagnosis Date  . CAD (coronary artery disease)   . Hyperlipidemia   . Hypertension      Patient Active Problem List   Diagnosis Date Noted  . CKD (chronic kidney  disease) stage 3, GFR 30-59 ml/min (HCC) 01/06/2016  . Benign neoplasm of colon 03/04/2015  . ED (erectile dysfunction) of organic origin 03/04/2015  . HLD (hyperlipidemia) 03/04/2015  . Asthma, mild intermittent 03/04/2015  . Chronic systolic heart failure ([place]) 12/07/2013  . Arteriosclerosis of autologous vein coronary artery bypass graft 12/07/2013  . Benign essential HTN 01/07/2009  . Asthma, exogenous 02/26/2008  . CAD in native artery 01/05/2008    Past Surgical History:  Procedure Laterality Date  . COLONOSCOPY WITH PROPOFOL N/A 11/03/2016   Procedure: COLONOSCOPY WITH PROPOFOL;  Surgeon: Manya Silvas, MD;  Location: [facility] END[facility];  Service: Endoscopy;  Laterality: N/A;  . CORONARY ARTERY BYPASS GRAFT    . TONSILLECTOMY AND ADENOIDECTOMY    . VASECTOMY      His family history includes CAD in his father; Diabetes in his sister; Healthy in his daughter, daughter, and mother; Heart attack in his maternal grandfather, maternal grandmother, paternal grandfather, and sister; Heart disease in his father; Stroke in his father.      Current Outpatient Medications:  .  albuterol (PROVENTIL HFA;VENTOLIN HFA) 108 (90 Base) MCG/ACT inhaler, INHALE 1-2 PUFFS BY MOUTH 4 TIMES DAILY AS NEEDED, Disp: 8.5 Inhaler, Rfl: 2 .  aspirin 325 MG tablet, , Disp: , Rfl:  .  carvedilol (COREG) 6.25 MG tablet, Take by mouth., Disp: , Rfl:  .  lisinopril (PRINIVIL,ZESTRIL) 40 MG tablet, Take 40 mg  by mouth. , Disp: , Rfl:  .  triamterene-hydrochlorothiazide (MAXZIDE-25) 37.5-25 MG tablet, 1 tablet daily, Disp: , Rfl:  .  rosuvastatin (CRESTOR) 10 MG tablet, Take 1 tablet by mouth daily., Disp: , Rfl:   Patient Care Team: Elgene Coral, Vickki Muff, PA as PCP - General (Family Medicine)     Objective:   Vitals: BP 122/82 (BP Location: Right Arm, Patient Position: Sitting, Cuff Size: Large)   Pulse 80   Temp 98.2 F (36.8 C) (Oral)   Resp 16   Ht 5\' 10"  (1.778 m)   Wt 198 lb 12.8 oz (90.2 kg)   BMI  28.52 kg/m  Wt Readings from Last 3 Encounters:  03/14/17 198 lb 12.8 oz (90.2 kg)  11/03/16 188 lb (85.3 kg)  05/21/16 196 lb (88.9 kg)   Physical Exam  Constitutional: He is oriented to person, place, and time. He appears well-developed and well-nourished.  HENT:  Head: Normocephalic and atraumatic.  Right Ear: External ear normal.  Left Ear: External ear normal.  Nose: Nose normal.  Mouth/Throat: Oropharynx is clear and moist.  Eyes: Conjunctivae and EOM are normal. Pupils are equal, round, and reactive to light. Right eye exhibits no discharge.  Neck: Normal range of motion. Neck supple. No tracheal deviation present. No thyromegaly present.  Cardiovascular: Normal rate, regular rhythm, normal heart sounds and intact distal pulses.  No murmur heard. Pulmonary/Chest: Effort normal and breath sounds normal. No respiratory distress. He has no wheezes. He has no rales. He exhibits no tenderness.  Abdominal: Soft. He exhibits no distension and no mass. There is no tenderness. There is no rebound and no guarding.  Genitourinary: Rectum normal, prostate normal and penis normal. Rectal exam shows guaiac negative stool.  Genitourinary Comments: Few small soft hemorrhoidal tags.  Musculoskeletal: Normal range of motion. He exhibits no edema or tenderness.  Lymphadenopathy:    He has no cervical adenopathy.  Neurological: He is alert and oriented to person, place, and time. He has normal reflexes. No cranial nerve deficit. He exhibits normal muscle tone. Coordination normal.  Skin: Skin is warm and dry. No rash noted. No erythema.  Psychiatric: He has a normal mood and affect. His behavior is normal. Judgment and thought content normal.    Activities of Daily Living In your present state of health, do you have any difficulty performing the following activities: 03/14/2017  Hearing? Y  Vision? N  Difficulty concentrating or making decisions? N  Walking or climbing stairs? N  Dressing or  bathing? N  Doing errands, shopping? N  Some recent data might be hidden    Fall Risk Assessment Fall Risk  03/14/2017  Falls in the past year? No     Depression Screen PHQ 2/9 Scores 03/14/2017 03/10/2015  PHQ - 2 Score 0 2  PHQ- 9 Score 0 4    Cognitive Testing - 6-CIT  Correct? Score   What year is it? yes 0 0 or 4  What month is it? yes 0 0 or 3  Memorize:    Pia Mau,  43,  [place],      What time is it? (within 1 hour) yes 0 0 or 3  Count backwards from 20 yes 0 0, 2, or 4  Name the months of the year yes 0 0, 2, or 4  Repeat name & address above no 6 0, 2, 4, 6, 8, or 10       TOTAL SCORE  6/28   Interpretation:  Normal  Normal (0-7) Abnormal (8-28)   Assessment & Plan:     Annual Wellness Visit  Reviewed patient's Family Medical History Reviewed and updated list of patient's medical providers Assessment of cognitive impairment was done Assessed patient's functional ability Established a written schedule for health screening [place] Completed and Reviewed  Exercise Activities and Dietary recommendations Goals    None      Immunization History  Administered Date(s) Administered  . Influenza Split 12/27/2011, 12/08/2012  . Influenza,inj,Quad PF,6+ Mos 11/14/2013  . Pneumococcal Polysaccharide-23 02/15/2012  . Tdap 02/15/2012  . Zoster 02/15/2012    Health Maintenance  Topic Date Due  . PNA vac Low Risk Adult (1 of 2 - PCV13) 07/31/2016  . INFLUENZA VACCINE  09/01/2016  . TETANUS/TDAP  02/14/2022  . COLONOSCOPY  11/16/2026  . Hepatitis C Screening  Completed  . HIV Screening  Completed     Discussed health benefits of physical activity, and encouraged him to engage in regular exercise appropriate for his age and condition.    ------------------------------------------------------------------------------------------------------------ 1. Annual physical exam General health stable. Exercising well without chest  pains or dyspnea with history of CABG in 2009. 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 Will up date pneumonia vaccination. Last colonoscopy showed 2 benign polyps on 11-03-16  and states he got a flu shot at [facility] in October 2018. Recheck routine labs and given anticipatory guidance. Follow up pending lab reports. - CBC with Differential/Platelet - Comprehensive metabolic panel - Lipid Panel With LDL/HDL Ratio  2. Mixed hyperlipidemia Tolerating Rosuvastatin 10 mg qd and low fat diet. 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 Recheck labs and follow up with cardiologist regularly. - Comprehensive metabolic panel - Lipid Panel With LDL/HDL Ratio - TSH  3. CKD (chronic kidney disease) stage 3, GFR 30-59 ml/min (HCC) Still on Lisinopril daily. Last creatinine levels was 1.19 with GFR 64 on 03-11-16. No edema, dyspnea or urinary tract symptoms. Recheck CMP and follow up pending reports. - Comprehensive metabolic panel  4. Benign essential HTN Stable and well controlled. Still taking Carvedilol 6.25 mg BID, Maxzide-25 qd and Lisinopril 40 mg qd. Recheck routine labs. History of CAD with CABG and chronic systolic CHF followed by Dr. Nehemiah Massed (cardiologist) on 01-18-17. No chest pains, palpitations, dyspnea or peripheral edema. Last echocardiogram showed EF 35% a year ago. Follow up with Dr. Nehemiah Massed 07-19-17. - CBC with Differential/Platelet - Comprehensive metabolic panel - TSH  5. Need for Streptococcus pneumoniae vaccination - Pneumococcal conjugate vaccine 13-valent IM    [name], PA  [facility]

## 2017-03-16 LAB — COMPREHENSIVE METABOLIC PANEL
ALBUMIN: 4.4 g/dL (ref 3.6–4.8)
ALK PHOS: 102 IU/L (ref 39–117)
ALT: 37 IU/L (ref 0–44)
AST: 32 IU/L (ref 0–40)
Albumin/Globulin Ratio: 2.4 — ABNORMAL HIGH (ref 1.2–2.2)
BILIRUBIN TOTAL: 1 mg/dL (ref 0.0–1.2)
BUN / CREAT RATIO: 14 (ref 10–24)
BUN: 17 mg/dL (ref 8–27)
CHLORIDE: 105 mmol/L (ref 96–106)
CO2: 25 mmol/L (ref 20–29)
Calcium: 9.1 mg/dL (ref 8.6–10.2)
Creatinine, Ser: 1.19 mg/dL (ref 0.76–1.27)
GFR calc Af Amer: 74 mL/min/{1.73_m2} (ref >59)
GFR calc non Af Amer: 64 mL/min/{1.73_m2} (ref >59)
GLOBULIN, TOTAL: 1.8 g/dL (ref 1.5–4.5)
Glucose: 96 mg/dL (ref 65–99)
Potassium: 3.9 mmol/L (ref 3.5–5.2)
SODIUM: 146 mmol/L — AB (ref 134–144)
Total Protein: 6.2 g/dL (ref 6.0–8.5)

## 2017-03-16 LAB — LIPID PANEL WITH LDL/HDL RATIO
CHOLESTEROL TOTAL: 132 mg/dL (ref 100–199)
HDL: 43 mg/dL (ref >39)
LDL Calculated: 68 mg/dL (ref 0–99)
LDl/HDL Ratio: 1.6 ratio (ref 0.0–3.6)
TRIGLYCERIDES: 103 mg/dL (ref 0–149)
VLDL Cholesterol Cal: 21 mg/dL (ref 5–40)

## 2017-03-16 LAB — CBC WITH DIFFERENTIAL/PLATELET
BASOS ABS: 0 10*3/uL (ref 0.0–0.2)
Basos: 1 %
EOS (ABSOLUTE): 0.4 10*3/uL (ref 0.0–0.4)
Eos: 5 %
HEMOGLOBIN: 14 g/dL (ref 13.0–17.7)
Hematocrit: 40.7 % (ref 37.5–51.0)
Immature Grans (Abs): 0 10*3/uL (ref 0.0–0.1)
Immature Granulocytes: 0 %
LYMPHS ABS: 1.7 10*3/uL (ref 0.7–3.1)
Lymphs: 23 %
MCH: 32.5 pg (ref 26.6–33.0)
MCHC: 34.4 g/dL (ref 31.5–35.7)
MCV: 94 fL (ref 79–97)
MONOCYTES: 10 %
MONOS ABS: 0.8 10*3/uL (ref 0.1–0.9)
NEUTROS ABS: 4.6 10*3/uL (ref 1.4–7.0)
Neutrophils: 61 %
Platelets: 177 10*3/uL (ref 150–379)
RBC: 4.31 x10E6/uL (ref 4.14–5.80)
RDW: 13.5 % (ref 12.3–15.4)
WBC: 7.5 10*3/uL (ref 3.4–10.8)

## 2017-03-16 LAB — TSH: TSH: 3.02 u[IU]/mL (ref 0.450–4.500)

## 2017-07-22 ENCOUNTER — Encounter: Payer: Self-pay | Admitting: Family Medicine

## 2017-07-22 ENCOUNTER — Ambulatory Visit (INDEPENDENT_AMBULATORY_CARE_PROVIDER_SITE_OTHER): Payer: BLUE CROSS/BLUE SHIELD | Admitting: Family Medicine

## 2017-07-22 VITALS — BP 116/74 | HR 59 | Temp 98.1°F | Wt 194.8 lb

## 2017-07-22 DIAGNOSIS — J029 Acute pharyngitis, unspecified: Secondary | ICD-10-CM | POA: Diagnosis not present

## 2017-07-22 DIAGNOSIS — I10 Essential (primary) hypertension: Secondary | ICD-10-CM | POA: Diagnosis not present

## 2017-07-22 DIAGNOSIS — R5381 Other malaise: Secondary | ICD-10-CM

## 2017-07-22 DIAGNOSIS — R05 Cough: Secondary | ICD-10-CM | POA: Diagnosis not present

## 2017-07-22 DIAGNOSIS — R059 Cough, unspecified: Secondary | ICD-10-CM

## 2017-07-22 MED ORDER — AMOXICILLIN 875 MG PO TABS: 875.0000 mg | ORAL_TABLET | Freq: Two times a day (BID) | ORAL | 0 refills | Status: DC

## 2017-07-22 NOTE — Progress Notes (Signed)
Patient: Joseph Cooper Male    DOB: 1950/06/05   67 y.o.   MRN: [id number] Visit Date: 07/22/2017  Today's Provider: [name], PA   Chief Complaint  Patient presents with  . Hypertension    follow up    Subjective:    HPI  Hypertension, follow-up:  BP Readings from Last 3 Encounters:  07/22/17 116/74  03/14/17 122/82  11/03/16 126/79    He was last seen for hypertension 4 months ago.  BP at that visit was 122/82. Management changes since that visit include no changes continue medications. He reports good compliance with treatment. He is not having side effects.  He is exercising. He is adherent to low salt diet.   Outside blood pressures are not being checked. He is experiencing lightheadedness, dizziness, and not feeling well.  Patient denies chest pain, chest pressure/discomfort, claudication, dyspnea, exertional chest pressure/discomfort, lower extremity edema, near-syncope, orthopnea, palpitations, paroxysmal nocturnal dyspnea, syncope and tachypnea.   Cardiovascular risk factors include advanced age (older than 65 for men, 78 for women), dyslipidemia, hypertension and male gender.  Use of agents associated with hypertension: none.     Weight trend: stable Wt Readings from Last 3 Encounters:  07/22/17 194 lb 12.8 oz (88.4 kg)  03/14/17 198 lb 12.8 oz (90.2 kg)  11/03/16 188 lb (85.3 kg)   ------------------------------------------------------------------------  No Known Allergies   Current Outpatient Medications:  .  albuterol (PROVENTIL HFA;VENTOLIN HFA) 108 (90 Base) MCG/ACT inhaler, INHALE 1-2 PUFFS BY MOUTH 4 TIMES DAILY AS NEEDED, Disp: 8.5 Inhaler, Rfl: 2 .  aspirin 325 MG tablet, , Disp: , Rfl:  .  carvedilol (COREG) 6.25 MG tablet, Take by mouth., Disp: , Rfl:  .  lisinopril (PRINIVIL,ZESTRIL) 40 MG tablet, Take 40 mg by mouth. , Disp: , Rfl:  .  triamterene-hydrochlorothiazide (MAXZIDE-25) 37.5-25 MG tablet, 1 tablet daily, Disp: , Rfl:    .  rosuvastatin (CRESTOR) 10 MG tablet, Take 1 tablet by mouth daily., Disp: , Rfl:   Review of Systems  Constitutional: Negative.   Respiratory: Negative.   Cardiovascular: Negative.   Neurological: Positive for dizziness and light-headedness.    Social History   Tobacco Use  . Smoking status: Never Smoker  . Smokeless tobacco: Never Used  Substance Use Topics  . Alcohol use: Yes    Alcohol/week: 0.0 oz    Comment: occasionally    Objective:   BP 116/74 (BP Location: Right Arm, Patient Position: Sitting, Cuff Size: Normal)   Pulse (!) 59   Temp 98.1 F (36.7 C) (Oral)   Wt 194 lb 12.8 oz (88.4 kg)   SpO2 98%   BMI 27.95 kg/m   Physical Exam  Constitutional: He is oriented to person, place, and time. He appears well-developed and well-nourished. No distress.  HENT:  Head: Normocephalic and atraumatic.  Right Ear: Hearing normal.  Left Ear: Hearing normal.  Nose: Nose normal.  Eyes: Conjunctivae and lids are normal. Right eye exhibits no discharge. Left eye exhibits no discharge. No scleral icterus.  Cardiovascular: Normal rate.  Pulmonary/Chest: Effort normal and breath sounds normal. No respiratory distress.  Musculoskeletal: Normal range of motion.  Neurological: He is alert and oriented to person, place, and time.  Skin: Skin is intact. No lesion and no rash noted.  Psychiatric: He has a normal mood and affect. His speech is normal and behavior is normal. Thought content normal.      Assessment & Plan:     1. Benign essential HTN  Well controlled despite recent URI symptoms. Tolerating the Coreg 6.25 mg qd, Lisinopril 40 mg qd and Maxzide-25 qd. All labs normal on 03-17-17.   2. Malaise Onset 07-20-17 with scratchy throat, lightheaded sensation and cough with white sputum. No fever and no similar symptoms in family members or co-workers.  3. Cough No wheeze or rales. Worried about worsening of symptoms over the weekend with his history of CAD and CHF. Will give  Amoxil prescription to start if any fever or purulent sputum production starts. Recommend Mucinex-DM and increase in fluid intake. May take Tylenol prn. - amoxicillin (AMOXIL) 875 MG tablet; Take 1 tablet (875 mg total) by mouth 2 (two) times daily.  Dispense: 20 tablet; Refill: 0  4. Sore throat Scratchy without exudates or significant erythema. No fever today. Gargle with warm saltwater and may start Amoxil if fever develops over the weekend. - amoxicillin (AMOXIL) 875 MG tablet; Take 1 tablet (875 mg total) by mouth 2 (two) times daily.  Dispense: 20 tablet; Refill: [place], [place]  [facility]

## 2017-08-11 DIAGNOSIS — E782 Mixed hyperlipidemia: Secondary | ICD-10-CM | POA: Diagnosis not present

## 2017-08-11 DIAGNOSIS — I25711 Atherosclerosis of autologous vein coronary artery bypass graft(s) with angina pectoris with documented spasm: Secondary | ICD-10-CM | POA: Diagnosis not present

## 2017-08-11 DIAGNOSIS — I10 Essential (primary) hypertension: Secondary | ICD-10-CM | POA: Diagnosis not present

## 2017-08-11 DIAGNOSIS — I5022 Chronic systolic (congestive) heart failure: Secondary | ICD-10-CM | POA: Diagnosis not present

## 2017-08-12 ENCOUNTER — Other Ambulatory Visit: Payer: Self-pay | Admitting: Family Medicine

## 2017-09-03 ENCOUNTER — Other Ambulatory Visit: Payer: Self-pay | Admitting: Family Medicine

## 2017-10-06 DIAGNOSIS — H903 Sensorineural hearing loss, bilateral: Secondary | ICD-10-CM | POA: Diagnosis not present

## 2017-10-06 DIAGNOSIS — H6122 Impacted cerumen, left ear: Secondary | ICD-10-CM | POA: Diagnosis not present

## 2017-11-25 ENCOUNTER — Telehealth: Payer: Self-pay

## 2017-11-25 NOTE — Telephone Encounter (Signed)
Agree with plan for follow up appointment.

## 2017-11-25 NOTE — Telephone Encounter (Signed)
Patient states he is having blood on the perimeter of his stool and when he wipes. Patient states he have been dealing with a hernia, which has never been removed. Patient states his mother had about 10 inches of her colon remove and his is concerned about the blood. Patient schedule appointment to come to see you as well.

## 2017-11-28 ENCOUNTER — Encounter: Payer: Self-pay | Admitting: Family Medicine

## 2017-11-28 ENCOUNTER — Ambulatory Visit (INDEPENDENT_AMBULATORY_CARE_PROVIDER_SITE_OTHER): Payer: BLUE CROSS/BLUE SHIELD | Admitting: Family Medicine

## 2017-11-28 VITALS — BP 132/86 | HR 66 | Temp 99.0°F | Wt 191.0 lb

## 2017-11-28 DIAGNOSIS — K648 Other hemorrhoids: Secondary | ICD-10-CM | POA: Diagnosis not present

## 2017-11-28 MED ORDER — HYDROCORTISONE ACETATE 25 MG RE SUPP: 25.0000 mg | Freq: Two times a day (BID) | RECTAL | 0 refills | Status: DC

## 2017-11-28 NOTE — Progress Notes (Signed)
Patient: Joseph Cooper Male    DOB: [birth year]   67 y.o.   MRN: [id number] Visit Date: 11/28/2017  Today's Provider: [name], PA   Chief Complaint  Patient presents with  . Blood In Stools   Subjective:    HPI   Patient presents today for blood in stool. Patient states that he believes it might be coming from a hemorrhoid. Patient states that his mother had 8-10 inches of her colon removed. Patient states he is not having any abdominal pain or other symptoms that the moment. Past Medical History:  Diagnosis Date  . CAD (coronary artery disease)   . Hyperlipidemia   . Hypertension    Past Surgical History:  Procedure Laterality Date  . COLONOSCOPY WITH PROPOFOL N/A 11/03/2016   Procedure: COLONOSCOPY WITH PROPOFOL;  Surgeon: Manya Silvas, MD;  Location: [facility] ENDOSCOPY;  Service: Endoscopy;  Laterality: N/A;  . CORONARY ARTERY BYPASS GRAFT    . TONSILLECTOMY AND ADENOIDECTOMY    . VASECTOMY     Family History  Problem Relation Age of Onset  . Healthy Mother   . Stroke Father   . CAD Father   . Heart disease Father   . Diabetes Sister   . Heart attack Sister   . Healthy Daughter   . Heart attack Maternal Grandmother   . Heart attack Maternal Grandfather   . Heart attack Paternal Grandfather   . Healthy Daughter    No Known Allergies  Current Outpatient Medications:  .  albuterol (PROAIR HFA) 108 (90 Base) MCG/ACT inhaler, INHALE 1-2 PUFFS BY MOUTH 4 TIMES DAILY AS NEEDED, Disp: 8.5 Inhaler, Rfl: 2 .  amoxicillin (AMOXIL) 875 MG tablet, Take 1 tablet (875 mg total) by mouth 2 (two) times daily., Disp: 20 tablet, Rfl: 0 .  aspirin 325 MG tablet, , Disp: , Rfl:  .  carvedilol (COREG) 6.25 MG tablet, Take by mouth., Disp: , Rfl:  .  lisinopril (PRINIVIL,ZESTRIL) 40 MG tablet, Take 40 mg by mouth. , Disp: , Rfl:  .  triamterene-hydrochlorothiazide (MAXZIDE-25) 37.5-25 MG tablet, 1 tablet daily, Disp: , Rfl:  .  rosuvastatin (CRESTOR) 10 MG tablet,  Take 1 tablet by mouth daily., Disp: , Rfl:   Review of Systems  Constitutional: Negative.   HENT: Negative.   Eyes: Negative.   Respiratory: Negative.   Cardiovascular: Negative.   Gastrointestinal: Negative.   Endocrine: Negative.   Genitourinary: Negative.   Musculoskeletal: Negative.   Neurological: Negative.     Social History   Tobacco Use  . Smoking status: Never Smoker  . Smokeless tobacco: Never Used  Substance Use Topics  . Alcohol use: Yes    Alcohol/week: 0.0 standard drinks    Comment: occasionally    Objective:   BP 132/86 (BP Location: Left Arm, Patient Position: Sitting, Cuff Size: Normal)   Pulse 66   Temp 99 F (37.2 C) (Oral)   Wt 191 lb (86.6 kg)   SpO2 98%   BMI 27.41 kg/m  Vitals:   11/28/17 0814  BP: 132/86  Pulse: 66  Temp: 99 F (37.2 C)  TempSrc: Oral  SpO2: 98%  Weight: 191 lb (86.6 kg)     Physical Exam  Constitutional: He is oriented to person, place, and time. He appears well-developed and well-nourished. No distress.  HENT:  Head: Normocephalic and atraumatic.  Right Ear: Hearing normal.  Left Ear: Hearing normal.  Nose: Nose normal.  Eyes: Conjunctivae and lids are normal. Right eye  exhibits no discharge. Left eye exhibits no discharge. No scleral icterus.  Pulmonary/Chest: Effort normal. No respiratory distress.  Genitourinary: Rectal exam shows guaiac negative stool.  Genitourinary Comments: Soft external skin tags without thrombosis. Questionable ropey internal hemorrhoid at the 11 o'clock position. Stool negative for blood today. No pain.  Musculoskeletal: Normal range of motion.  Neurological: He is alert and oriented to person, place, and time.  Skin: Skin is intact. No lesion and no rash noted.  Psychiatric: He has a normal mood and affect. His speech is normal and behavior is normal. Thought content normal.      Assessment & Plan:     1. Internal hemorrhoid, bleeding Onset over the past few days. Internal  hemorrhoids seen on colonoscopy of 11-03-16 by Dr. Vira Agar (GI). Hemoccult test negative for blood in stools today. Continue extra fiber, water intake and may use stool softener if needed. Treat with Hydrocortisone suppositories twice a day for 3-5 days. Recheck if no better in a week to see if GI referral needed. - hydrocortisone (ANUSOL-HC) 25 MG suppository; Place 1 suppository (25 mg total) rectally 2 (two) times daily.  Dispense: 12 suppository; Refill: [place], PA  [facility]

## 2017-11-28 NOTE — Telephone Encounter (Signed)
Patient was seen on today 11/28/2017.,PC

## 2017-11-28 NOTE — Patient Instructions (Signed)
Hemorrhoids Hemorrhoids are swollen veins in and around the rectum or anus. There are two types of hemorrhoids:  Internal hemorrhoids. These occur in the veins that are just inside the rectum. They may poke through to the outside and become irritated and painful.  External hemorrhoids. These occur in the veins that are outside of the anus and can be felt as a painful swelling or hard lump near the anus.  Most hemorrhoids do not cause serious problems, and they can be managed with home treatments such as diet and lifestyle changes. If home treatments do not help your symptoms, procedures can be done to shrink or remove the hemorrhoids. What are the causes? This condition is caused by increased pressure in the anal area. This pressure may result from various things, including:  Constipation.  Straining to have a bowel movement.  Diarrhea.  Pregnancy.  Obesity.  Sitting for long periods of time.  Heavy lifting or other activity that causes you to strain.  Anal sex.  What are the signs or symptoms? Symptoms of this condition include:  Pain.  Anal itching or irritation.  Rectal bleeding.  Leakage of stool (feces).  Anal swelling.  One or more lumps around the anus.  How is this diagnosed? This condition can often be diagnosed through a visual exam. Other exams or tests may also be done, such as:  Examination of the rectal area with a gloved hand (digital rectal exam).  Examination of the anal canal using a small tube (anoscope).  A blood test, if you have lost a significant amount of blood.  A test to look inside the colon (sigmoidoscopy or colonoscopy).  How is this treated? This condition can usually be treated at home. However, various procedures may be done if dietary changes, lifestyle changes, and other home treatments do not help your symptoms. These procedures can help make the hemorrhoids smaller or remove them completely. Some of these procedures involve  surgery, and others do not. Common procedures include:  Rubber band ligation. Rubber bands are placed at the base of the hemorrhoids to cut off the blood supply to them.  Sclerotherapy. Medicine is injected into the hemorrhoids to shrink them.  Infrared coagulation. A type of light energy is used to get rid of the hemorrhoids.  Hemorrhoidectomy surgery. The hemorrhoids are surgically removed, and the veins that supply them are tied off.  Stapled hemorrhoidopexy surgery. A circular stapling device is used to remove the hemorrhoids and use staples to cut off the blood supply to them.  Follow these instructions at home: Eating and drinking  Eat foods that have a lot of fiber in them, such as whole grains, beans, nuts, fruits, and vegetables. Ask your health care provider about taking products that have added fiber (fiber supplements).  Drink enough fluid to keep your urine clear or pale yellow. Managing pain and swelling  Take warm sitz baths for 20 minutes, 3-4 times a day to ease pain and discomfort.  If directed, apply ice to the affected area. Using ice packs between sitz baths may be helpful. ? Put ice in a plastic bag. ? Place a towel between your skin and the bag. ? Leave the ice on for 20 minutes, 2-3 times a day. General instructions  Take over-the-counter and prescription medicines only as told by your health care provider.  Use medicated creams or suppositories as told.  Exercise regularly.  Go to the bathroom when you have the urge to have a bowel movement. Do not wait.    Avoid straining to have bowel movements.  Keep the anal area dry and clean. Use wet toilet paper or moist towelettes after a bowel movement.  Do not sit on the toilet for long periods of time. This increases blood pooling and pain. Contact a health care provider if:  You have increasing pain and swelling that are not controlled by treatment or medicine.  You have uncontrolled bleeding.  You  have difficulty having a bowel movement, or you are unable to have a bowel movement.  You have pain or inflammation outside the area of the hemorrhoids. This information is not intended to replace advice given to you by your health care provider. Make sure you discuss any questions you have with your health care provider. Document Released: 01/16/2000 Document Revised: 06/18/2015 Document Reviewed: 10/02/2014 Elsevier Interactive Patient Education  2018 Elsevier Inc.  

## 2018-01-12 ENCOUNTER — Telehealth: Payer: Self-pay | Admitting: Family Medicine

## 2018-01-12 NOTE — Telephone Encounter (Signed)
Pt needing some results from his recent labs.  Please give pt a call back asap.  Thanks, [employer]

## 2018-01-12 NOTE — Telephone Encounter (Signed)
Pt returned missed call. Needing: Total Cholesterol and HCL  Please call pt back.  Thanks, [employer]

## 2018-01-12 NOTE — Telephone Encounter (Signed)
Patient was advised of last labs.

## 2018-01-12 NOTE — Telephone Encounter (Signed)
LMTCB 01/12/2018  Thanks,   -Mickel Baas

## 2018-02-04 ENCOUNTER — Other Ambulatory Visit: Payer: Self-pay | Admitting: Family Medicine

## 2018-03-17 ENCOUNTER — Encounter: Payer: Self-pay | Admitting: Family Medicine

## 2018-03-17 ENCOUNTER — Ambulatory Visit (INDEPENDENT_AMBULATORY_CARE_PROVIDER_SITE_OTHER): Payer: BLUE CROSS/BLUE SHIELD | Admitting: Family Medicine

## 2018-03-17 VITALS — BP 110/60 | HR 74 | Temp 98.1°F | Resp 16 | Ht 70.0 in | Wt 194.0 lb

## 2018-03-17 DIAGNOSIS — I25810 Atherosclerosis of coronary artery bypass graft(s) without angina pectoris: Secondary | ICD-10-CM | POA: Diagnosis not present

## 2018-03-17 DIAGNOSIS — Z86010 Personal history of colonic polyps: Secondary | ICD-10-CM

## 2018-03-17 DIAGNOSIS — Z0000 Encounter for general adult medical examination without abnormal findings: Secondary | ICD-10-CM | POA: Diagnosis not present

## 2018-03-17 DIAGNOSIS — Z125 Encounter for screening for malignant neoplasm of prostate: Secondary | ICD-10-CM

## 2018-03-17 DIAGNOSIS — I10 Essential (primary) hypertension: Secondary | ICD-10-CM | POA: Diagnosis not present

## 2018-03-17 DIAGNOSIS — E782 Mixed hyperlipidemia: Secondary | ICD-10-CM

## 2018-03-17 DIAGNOSIS — Z23 Encounter for immunization: Secondary | ICD-10-CM

## 2018-03-17 DIAGNOSIS — J4520 Mild intermittent asthma, uncomplicated: Secondary | ICD-10-CM

## 2018-03-17 NOTE — Progress Notes (Signed)
Patient: Joseph Cooper, Male    DOB: Aug 21, 1950, 68 y.o.   MRN: [id number] Visit Date: 03/17/2018  Today's Provider: [name], PA   Chief Complaint  Patient presents with  . Annual Exam   Subjective:     Annual physical exam Joseph Cooper is a 68 y.o. male who presents today for health maintenance and complete physical. He feels well. He reports exercising yes-3 days a week. He reports he is sleeping fairly well.  ---------------------------------------------------------------   Review of Systems  Eyes: Positive for discharge.  Respiratory: Positive for wheezing.   All other systems reviewed and are negative.   Social History      He  reports that he has never smoked. He has never used smokeless tobacco. He reports current alcohol use. He reports that he does not use drugs.       Social History   Socioeconomic History  . Marital status: Married    Spouse name: Not on file  . Number of children: Not on file  . Years of education: Not on file  . Highest education level: Not on file  Occupational History  . Not on file  Social Needs  . Financial resource strain: Not on file  . Food insecurity:    Worry: Not on file    Inability: Not on file  . Transportation needs:    Medical: Not on file    Non-medical: Not on file  Tobacco Use  . Smoking status: Never Smoker  . Smokeless tobacco: Never Used  Substance and Sexual Activity  . Alcohol use: Yes    Alcohol/week: 0.0 standard drinks    Comment: occasionally   . Drug use: No  . Sexual activity: Not on file  Lifestyle  . Physical activity:    Days per week: Not on file    Minutes per session: Not on file  . Stress: Not on file  Relationships  . Social connections:    Talks on phone: Not on file    Gets together: Not on file    Attends religious service: Not on file    Active member of club or organization: Not on file    Attends meetings of clubs or organizations: Not on file    Relationship  status: Not on file  Other Topics Concern  . Not on file  Social History Narrative  . Not on file    Past Medical History:  Diagnosis Date  . CAD (coronary artery disease)   . Hyperlipidemia   . Hypertension    Patient Active Problem List   Diagnosis Date Noted  . Hx of adenomatous polyp of colon 07/02/2016  . CKD (chronic kidney disease) stage 3, GFR 30-59 ml/min (HCC) 01/06/2016  . Benign neoplasm of colon 03/04/2015  . ED (erectile dysfunction) of organic origin 03/04/2015  . HLD (hyperlipidemia) 03/04/2015  . Asthma, mild intermittent 03/04/2015  . Chronic systolic heart failure ([facility]) 12/07/2013  . Arteriosclerosis of autologous vein coronary artery bypass graft 12/07/2013  . Benign essential HTN 01/07/2009  . Asthma, exogenous 02/26/2008  . CAD in native artery 01/05/2008   Past Surgical History:  Procedure Laterality Date  . COLONOSCOPY WITH PROPOFOL N/A 11/03/2016   Procedure: COLONOSCOPY WITH PROPOFOL;  Surgeon: Manya Silvas, MD;  Location: [facility] ENDOSCOPY;  Service: Endoscopy;  Laterality: N/A;  . CORONARY ARTERY BYPASS GRAFT    . TONSILLECTOMY AND ADENOIDECTOMY    . VASECTOMY     Family History  Family Status  Relation Name Status  . Mother  Alive  . Father  Deceased at age 66  . Sister  Deceased  . Daughter 1 Alive  . MGM  Deceased  . MGF  Deceased at age 42  . PGM  Deceased  . PGF  Deceased  . Daughter 2 Alive        His family history includes CAD in his father; Diabetes in his sister; Healthy in his daughter, daughter, and mother; Heart attack in his maternal grandfather, maternal grandmother, paternal grandfather, and sister; Heart disease in his father; Stroke in his father.     No Known Allergies  Current Outpatient Medications:  .  albuterol (PROAIR HFA) 108 (90 Base) MCG/ACT inhaler, INHALE 1-2 PUFFS BY MOUTH 4 TIMES DAILY AS NEEDED, Disp: 8.5 Inhaler, Rfl: 2 .  aspirin 325 MG tablet, , Disp: , Rfl:  .  carvedilol (COREG) 6.25 MG  tablet, Take by mouth., Disp: , Rfl:  .  hydrocortisone (ANUSOL-HC) 25 MG suppository, Place 1 suppository (25 mg total) rectally 2 (two) times daily., Disp: 12 suppository, Rfl: 0 .  lisinopril (PRINIVIL,ZESTRIL) 40 MG tablet, Take 40 mg by mouth. , Disp: , Rfl:  .  rosuvastatin (CRESTOR) 10 MG tablet, Take 1 tablet by mouth daily., Disp: , Rfl:  .  triamterene-hydrochlorothiazide (MAXZIDE-25) 37.5-25 MG tablet, 1 tablet daily, Disp: , Rfl:  .  amoxicillin (AMOXIL) 875 MG tablet, Take 1 tablet (875 mg total) by mouth 2 (two) times daily. (Patient not taking: Reported on 03/17/2018), Disp: 20 tablet, Rfl: 0   Patient Care Team: Manny Vitolo, Vickki Muff, PA as PCP - General (Family Medicine)   Objective:    Vitals: BP 110/60 (BP Location: Right Arm, Patient Position: Sitting, Cuff Size: Large)   Pulse 74   Temp 98.1 F (36.7 C) (Oral)   Resp 16   Ht 5\' 10"  (1.778 m)   Wt 194 lb (88 kg)   SpO2 99%   BMI 27.84 kg/m    Vitals:   03/17/18 0922  BP: 110/60  Pulse: 74  Resp: 16  Temp: 98.1 F (36.7 C)  TempSrc: Oral  SpO2: 99%  Weight: 194 lb (88 kg)  Height: 5\' 10"  (1.778 m)    Physical Exam Constitutional:      Appearance: He is well-developed.  HENT:     Head: Normocephalic and atraumatic.     Right Ear: External ear normal.     Left Ear: External ear normal.     Nose: Nose normal.  Eyes:     General:        Right eye: No discharge.     Conjunctiva/sclera: Conjunctivae normal.     Pupils: Pupils are equal, round, and reactive to light.  Neck:     Musculoskeletal: Normal range of motion and neck supple.     Thyroid: No thyromegaly.     Trachea: No tracheal deviation.  Cardiovascular:     Rate and Rhythm: Normal rate and regular rhythm.     Heart sounds: Normal heart sounds. No murmur.  Pulmonary:     Effort: Pulmonary effort is normal. No respiratory distress.     Breath sounds: Normal breath sounds. No wheezing or rales.  Chest:     Chest wall: No tenderness.    Abdominal:     General: There is no distension.     Palpations: Abdomen is soft. There is no mass.     Tenderness: There is no abdominal tenderness. There is no guarding  or rebound.  Genitourinary:    Penis: Normal.      Scrotum/Testes: Normal.     Prostate: Normal.     Rectum: Guaiac result negative.     Comments: One soft, deflated skin tag at the 5 o'clock position. Musculoskeletal: Normal range of motion.        General: No tenderness.  Lymphadenopathy:     Cervical: No cervical adenopathy.  Skin:    General: Skin is warm and dry.     Findings: No erythema or rash.  Neurological:     Mental Status: He is alert and oriented to person, place, and time.     Cranial Nerves: No cranial nerve deficit.     Motor: No abnormal muscle tone.     Coordination: Coordination normal.     Deep Tendon Reflexes: Reflexes are normal and symmetric. Reflexes normal.  Psychiatric:        Behavior: Behavior normal.        Thought Content: Thought content normal.        Judgment: Judgment normal.     Depression Screen PHQ 2/9 Scores 03/17/2018 03/14/2017 03/10/2015  PHQ - 2 Score 0 0 2  PHQ- 9 Score 0 0 4     Assessment & Plan:     Routine Health Maintenance and Physical Exam  Exercise Activities and Dietary recommendations Goals   Exercises 3 days a week and still working as a Garment/textile technologist.     Immunization History  Administered Date(s) Administered  . Influenza Split 12/27/2011, 12/08/2012  . Influenza, High Dose Seasonal PF 11/06/2016  . Influenza,inj,Quad PF,6+ Mos 11/14/2013  . Pneumococcal Conjugate-13 03/14/2017  . Pneumococcal Polysaccharide-23 02/15/2012  . Tdap 02/15/2012  . Zoster 02/15/2012    Health Maintenance  Topic Date Due  . INFLUENZA VACCINE  09/01/2017  . PNA vac Low Risk Adult (2 of 2 - PPSV23) 03/14/2018  . TETANUS/TDAP  02/14/2022  . COLONOSCOPY  11/16/2026  . Hepatitis C Screening  Completed     Discussed health benefits of physical activity,  and encouraged him to engage in regular exercise appropriate for his age and condition.    -------------------------------------------------------------------- 1. Annual physical exam General health stable. 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 Exercises 3 days a week and feeling well. Given anticipatory counseling and will give last pneumonia vaccine today with flu shot.  2. Coronary atherosclerosis of autologous vein bypass graft without angina No recent chest pains or use of NTG. Followed by cardiologist (Dr. Nehemiah Massed) annually. Has a history of NYHA Class 2 CHF and EF of 35% last year. No significant weight gain or loss recently. 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 History of CABG in 2009. Recheck routine labs. - CBC with Differential/Platelet - Comprehensive metabolic panel - Lipid panel - TSH  3. Benign essential HTN Well controlled BP with use of the Lisinopril 40 mg qd, Maxzide-25 qd and Carvedilol 6.25 mg BID. Continue salt restriction and recheck routine labs. - CBC with Differential/Platelet - Comprehensive metabolic panel - Lipid panel - TSH  4. Mixed hyperlipidemia Tolerating the Crestor 10 mg qd without muscle weakness or pains. Recheck labs and continue low fat diet with regular exercise program. - Comprehensive metabolic panel - Lipid panel - TSH  5. Hx of adenomatous polyp of colon Last colonoscopy was in 2018 with 6 benign polyps being removed. Awaiting notification by  Dr. Vira Agar (GI) for next screening in 2023. Negative hemoccult slide during DRE. Check CBC for signs of anemia. Denies melena or hematochezia recently. - CBC with Differential/Platelet  6. Mild intermittent asthma without complication Usually has slight wheezing with exercise or weather changes. Will use the Albuterol-HFA only prn (does not need it every day). Check CBC for signs  of anemia or infection. - CBC with Differential/Platelet  7. Needs flu shot - Flu vaccine HIGH DOSE PF  8. Need for Streptococcus pneumoniae vaccination - Pneumococcal polysaccharide vaccine 23-valent greater than or equal to 2yo subcutaneous/IM  9. Screening PSA (prostate specific antigen) Normal DRE and denies significant hesitancy, frequency or nocturia. Screen PSA. - PSA    [name], PA  [facility]

## 2018-05-15 DIAGNOSIS — I25711 Atherosclerosis of autologous vein coronary artery bypass graft(s) with angina pectoris with documented spasm: Secondary | ICD-10-CM | POA: Diagnosis not present

## 2018-05-15 DIAGNOSIS — I6523 Occlusion and stenosis of bilateral carotid arteries: Secondary | ICD-10-CM | POA: Diagnosis not present

## 2018-05-15 DIAGNOSIS — I5022 Chronic systolic (congestive) heart failure: Secondary | ICD-10-CM | POA: Diagnosis not present

## 2018-05-15 DIAGNOSIS — I10 Essential (primary) hypertension: Secondary | ICD-10-CM | POA: Diagnosis not present

## 2018-06-01 DIAGNOSIS — L820 Inflamed seborrheic keratosis: Secondary | ICD-10-CM | POA: Diagnosis not present

## 2018-06-01 DIAGNOSIS — D485 Neoplasm of uncertain behavior of skin: Secondary | ICD-10-CM | POA: Diagnosis not present

## 2018-06-01 DIAGNOSIS — Z1283 Encounter for screening for malignant neoplasm of skin: Secondary | ICD-10-CM | POA: Diagnosis not present

## 2018-06-01 DIAGNOSIS — L570 Actinic keratosis: Secondary | ICD-10-CM | POA: Diagnosis not present

## 2018-06-01 DIAGNOSIS — L578 Other skin changes due to chronic exposure to nonionizing radiation: Secondary | ICD-10-CM | POA: Diagnosis not present

## 2018-06-01 DIAGNOSIS — Z85828 Personal history of other malignant neoplasm of skin: Secondary | ICD-10-CM | POA: Diagnosis not present

## 2018-06-14 DIAGNOSIS — I25711 Atherosclerosis of autologous vein coronary artery bypass graft(s) with angina pectoris with documented spasm: Secondary | ICD-10-CM | POA: Diagnosis not present

## 2018-06-14 DIAGNOSIS — I6523 Occlusion and stenosis of bilateral carotid arteries: Secondary | ICD-10-CM | POA: Diagnosis not present

## 2018-06-14 DIAGNOSIS — I5022 Chronic systolic (congestive) heart failure: Secondary | ICD-10-CM | POA: Diagnosis not present

## 2018-06-15 DIAGNOSIS — I6523 Occlusion and stenosis of bilateral carotid arteries: Secondary | ICD-10-CM | POA: Diagnosis not present

## 2018-06-19 DIAGNOSIS — I25711 Atherosclerosis of autologous vein coronary artery bypass graft(s) with angina pectoris with documented spasm: Secondary | ICD-10-CM | POA: Diagnosis not present

## 2018-06-19 DIAGNOSIS — I5022 Chronic systolic (congestive) heart failure: Secondary | ICD-10-CM | POA: Diagnosis not present

## 2018-06-19 DIAGNOSIS — I10 Essential (primary) hypertension: Secondary | ICD-10-CM | POA: Diagnosis not present

## 2018-06-19 DIAGNOSIS — I6523 Occlusion and stenosis of bilateral carotid arteries: Secondary | ICD-10-CM | POA: Diagnosis not present

## 2018-07-06 DIAGNOSIS — E782 Mixed hyperlipidemia: Secondary | ICD-10-CM | POA: Diagnosis not present

## 2018-08-24 ENCOUNTER — Other Ambulatory Visit: Payer: Self-pay

## 2018-08-24 ENCOUNTER — Other Ambulatory Visit: Payer: Self-pay | Admitting: Family Medicine

## 2018-08-24 ENCOUNTER — Encounter: Payer: Self-pay | Admitting: Family Medicine

## 2018-08-24 ENCOUNTER — Telehealth: Payer: Self-pay

## 2018-08-24 ENCOUNTER — Ambulatory Visit (INDEPENDENT_AMBULATORY_CARE_PROVIDER_SITE_OTHER): Payer: BC Managed Care – PPO | Admitting: Family Medicine

## 2018-08-24 VITALS — Temp 97.9°F

## 2018-08-24 DIAGNOSIS — R6889 Other general symptoms and signs: Secondary | ICD-10-CM

## 2018-08-24 DIAGNOSIS — R062 Wheezing: Secondary | ICD-10-CM

## 2018-08-24 DIAGNOSIS — R05 Cough: Secondary | ICD-10-CM | POA: Diagnosis not present

## 2018-08-24 DIAGNOSIS — Z20822 Contact with and (suspected) exposure to covid-19: Secondary | ICD-10-CM

## 2018-08-24 DIAGNOSIS — I5022 Chronic systolic (congestive) heart failure: Secondary | ICD-10-CM | POA: Diagnosis not present

## 2018-08-24 DIAGNOSIS — R059 Cough, unspecified: Secondary | ICD-10-CM

## 2018-08-24 MED ORDER — AZITHROMYCIN 250 MG PO TABS: ORAL_TABLET | ORAL | 0 refills | Status: DC

## 2018-08-24 MED ORDER — PREDNISONE 10 MG PO TABS: ORAL_TABLET | ORAL | 0 refills | Status: DC

## 2018-08-24 MED ORDER — AMOXICILLIN-POT CLAVULANATE 875-125 MG PO TABS: 1.0000 | ORAL_TABLET | Freq: Two times a day (BID) | ORAL | 0 refills | Status: DC
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
## 2018-08-24 NOTE — Progress Notes (Signed)
Joseph Cooper  MRN: [id number] DOB: 03-11-1950  Subjective:  HPI   The patient is a 68 year old male who presents via electronic device for evaluation of respiratory symptoms that he has had all week.  He does not believe he has had any fever or loss of the sense of smell or taste but he has had the respiratory symptoms listed below.  The patient has not had any known exposure to COVID.  Virtual Visit via Video Note  I connected with Clovia Cuff on 08/24/18 at 10:40 AM EDT by a video enabled telemedicine application and verified that I am speaking with the correct person using two identifiers.  Location: Patient: home Provider: office   I discussed the limitations of evaluation and management by telemedicine and the availability of in person appointments. The patient expressed understanding and agreed to proceed.   Patient Active Problem List   Diagnosis Date Noted  . Hx of adenomatous polyp of colon 07/02/2016  . CKD (chronic kidney disease) stage 3, GFR 30-59 ml/min (HCC) 01/06/2016  . Benign neoplasm of colon 03/04/2015  . ED (erectile dysfunction) of organic origin 03/04/2015  . HLD (hyperlipidemia) 03/04/2015  . Asthma, mild intermittent 03/04/2015  . Chronic systolic heart failure ([place]) 12/07/2013  . Arteriosclerosis of autologous vein coronary artery bypass graft 12/07/2013  . Benign essential HTN 01/07/2009  . Asthma, exogenous 02/26/2008  . CAD in native artery 01/05/2008    Past Medical History:  Diagnosis Date  . CAD (coronary artery disease)   . Hyperlipidemia   . Hypertension     Social History   Socioeconomic History  . Marital status: Married    Spouse name: Not on file  . Number of children: Not on file  . Years of education: Not on file  . Highest education level: Not on file  Occupational History  . Not on file  Social Needs  . Financial resource strain: Not on file  . Food insecurity    Worry: Not on file    Inability: Not on file  .  Transportation needs    Medical: Not on file    Non-medical: Not on file  Tobacco Use  . Smoking status: Never Smoker  . Smokeless tobacco: Never Used  Substance and Sexual Activity  . Alcohol use: Yes    Alcohol/week: 0.0 standard drinks    Comment: occasionally   . Drug use: No  . Sexual activity: Not on file  Lifestyle  . Physical activity    Days per week: Not on file    Minutes per session: Not on file  . Stress: Not on file  Relationships  . Social Herbalist on phone: Not on file    Gets together: Not on file    Attends religious service: Not on file    Active member of club or organization: Not on file    Attends meetings of clubs or organizations: Not on file    Relationship status: Not on file  . Intimate partner violence    Fear of current or ex partner: Not on file    Emotionally abused: Not on file    Physically abused: Not on file    Forced sexual activity: Not on file  Other Topics Concern  . Not on file  Social History Narrative  . Not on file    Outpatient Encounter Medications as of 08/24/2018  Medication Sig  . albuterol (PROAIR HFA) 108 (90 Base) MCG/ACT inhaler INHALE 1-2  PUFFS BY MOUTH 4 TIMES DAILY AS NEEDED  . aspirin 325 MG tablet 160 mg.   . carvedilol (COREG) 6.25 MG tablet Take 6.25 mg by mouth 2 (two) times daily.  Marland Kitchen lisinopril (PRINIVIL,ZESTRIL) 40 MG tablet Take 40 mg by mouth.   . rosuvastatin (CRESTOR) 10 MG tablet Take 1 tablet by mouth daily.  Marland Kitchen triamterene-hydrochlorothiazide (MAXZIDE-25) 37.5-25 MG tablet 1 tablet daily  . hydrocortisone (ANUSOL-HC) 25 MG suppository Place 1 suppository (25 mg total) rectally 2 (two) times daily. (Patient not taking: Reported on 08/24/2018)  . [DISCONTINUED] amoxicillin (AMOXIL) 875 MG tablet Take 1 tablet (875 mg total) by mouth 2 (two) times daily. (Patient not taking: Reported on 03/17/2018)   No facility-administered encounter medications on file as of 08/24/2018.     No Known Allergies   Review of Systems  Constitutional: Positive for malaise/fatigue. Negative for chills, diaphoresis and fever.  HENT: Positive for congestion (chest) and sore throat. Negative for ear discharge, ear pain, sinus pain and tinnitus.   Respiratory: Positive for cough, sputum production, shortness of breath and wheezing.   Gastrointestinal: Negative for abdominal pain, constipation, nausea and vomiting.  Musculoskeletal: Positive for myalgias.  Neurological: Negative for headaches.    Objective:  Temp 97.9 F (36.6 C) (Oral)   WDWN male with mild dyspnea. Head: Normocephalic, atraumatic. Neck: Supple, NROM Respiratory: Slight wheeze and dyspnea with exertion. Psych: Normal mood and affect   Assessment and Plan :  1. Cough Developed sore throat 08-21-18 but changed to cough with shortness of breath and weakness the next day. Feels DOE to cross the room now. No significant distress sitting still during video conference. Will get COVID-19 test and treat for bronchitis. Patient agreed to go to ER or call 911 if dyspnea worsens. - azithromycin (ZITHROMAX) 250 MG tablet; Take 2 tablets by mouth today then one daily for 4 days.  Dispense: 6 tablet; Refill: 0  2. Wheeze Wheezing and shortness of breath walking across the room at home. No fever or loss of taste/smell. Continue to use Proair-HFA 2 puffs QID prn and add prednisone 10 mg 6 day taper. May need to go to ER if wheezing worsens and no relief from Albuterol inhaler. - predniSONE (DELTASONE) 10 MG tablet; Taper down by one tablet daily starting with 6 tablets the first day (6,5,4,3,2,1)  Dispense: 21 tablet; Refill: 0  3. Suspected Covid-19 Virus Infection Having cough, wheeze, sore throat, weakness and DOE for the past 4 days. No fever or chills. No GI symptoms. Will schedule COVID-19 test with home monitoring program. May use OTC cough medications, inhaler and prednisone taper. Advised to isolate at home, wash hands frequently and drink  extra fluids. If dyspnea on exertion worsens, advised to go to the ER or call 911. - Novel Coronavirus, NAA ([facility]) - MyChart COVID-19 home monitoring program - Temperature monitoring; Future  4. Chronic systolic heart failure (HCC) Stable without peripheral edema noticed. Recent shortness of breath may be due to asthmatic bronchitis or Coronavirus infection. Advised to monitor for worsening dyspnea and go to ER if progressing.  I discussed the assessment and treatment plan with the patient. The patient was provided an opportunity to ask questions and all were answered. The patient agreed with the plan and demonstrated an understanding of the instructions.   The patient was advised to call back or seek an in-person evaluation if the symptoms worsen or if the condition fails to improve as anticipated.  I provided 15 minutes of non-face-to-face time during this  encounter.

## 2018-08-24 NOTE — Telephone Encounter (Signed)
Switched to Augmentin from the Z-pak. Sent to the [facility] [address] [address].

## 2018-08-25 NOTE — Telephone Encounter (Signed)
Advised 

## 2018-08-27 LAB — SPECIMEN STATUS REPORT

## 2018-08-27 LAB — NOVEL CORONAVIRUS, NAA: SARS-CoV-2, NAA: NOT DETECTED

## 2018-08-28 ENCOUNTER — Telehealth: Payer: Self-pay

## 2018-08-28 NOTE — Telephone Encounter (Signed)
Pt advised.   Thanks,   -Laura  

## 2018-08-28 NOTE — Telephone Encounter (Signed)
-----   Message from Margo Common, [place] sent at 08/28/2018 12:26 PM EDT ----- COVID-19 test is negative. Continue pandemic restrictions of frequent hand washing, social distancing and use mask in public. May need retesting if any COVID-19 symptoms occurring.

## 2018-10-23 ENCOUNTER — Other Ambulatory Visit: Payer: Self-pay | Admitting: Family Medicine

## 2018-12-21 ENCOUNTER — Other Ambulatory Visit: Payer: Self-pay

## 2018-12-21 ENCOUNTER — Ambulatory Visit (INDEPENDENT_AMBULATORY_CARE_PROVIDER_SITE_OTHER): Payer: BC Managed Care – PPO | Admitting: Family Medicine

## 2018-12-21 ENCOUNTER — Encounter: Payer: Self-pay | Admitting: Family Medicine

## 2018-12-21 VITALS — BP 138/80 | HR 60 | Temp 97.7°F | Resp 16 | Wt 195.0 lb

## 2018-12-21 DIAGNOSIS — H538 Other visual disturbances: Secondary | ICD-10-CM

## 2018-12-21 DIAGNOSIS — E782 Mixed hyperlipidemia: Secondary | ICD-10-CM

## 2018-12-21 DIAGNOSIS — I10 Essential (primary) hypertension: Secondary | ICD-10-CM

## 2018-12-21 DIAGNOSIS — B001 Herpesviral vesicular dermatitis: Secondary | ICD-10-CM

## 2018-12-21 DIAGNOSIS — R34 Anuria and oliguria: Secondary | ICD-10-CM

## 2018-12-21 MED ORDER — VALACYCLOVIR HCL 1 G PO TABS: ORAL_TABLET | ORAL | 0 refills | Status: DC

## 2018-12-21 NOTE — Progress Notes (Signed)
Joseph Cooper  MRN: [id number] DOB: Nov 30, 1950  Subjective:  HPI    Hypertension, follow-up:  BP Readings from Last 3 Encounters:  12/21/18 138/80  03/17/18 110/60  11/28/17 132/86    He was last seen for hypertension 9 months ago.  BP at that visit was 110/60. Management changes since that visit include none. He reports excellent compliance with treatment. He is not having side effects.  He is exercising 1-2x a week 15min walk and playing golf. He is adherent to low salt diet.   Outside blood pressures are not being checked . He is experiencing none.  Patient denies chest pain, chest pressure/discomfort, claudication, dyspnea, exertional chest pressure/discomfort, fatigue, irregular heart beat, lower extremity edema, near-syncope, orthopnea, palpitations, paroxysmal nocturnal dyspnea, syncope and tachypnea.   Cardiovascular risk factors include advanced age (older than 77 for men, 12 for women), hypertension and male gender.  Use of agents associated with hypertension: none.  Weight trend: stable Wt Readings from Last 3 Encounters:  12/21/18 195 lb (88.5 kg)  03/17/18 194 lb (88 kg)  11/28/17 191 lb (86.6 kg)   Current diet: well balanced  ------------------------------------------------------------------------  Lipid/Cholesterol, Follow-up:   Last seen for this 9 months ago.  Management changes since that visit include none. . Last Lipid Panel:    Component Value Date/Time   CHOL 132 03/15/2017 0814   TRIG 103 03/15/2017 0814   HDL 43 03/15/2017 0814   CHOLHDL 3.3 03/11/2016 1104   LDLCALC 68 03/15/2017 0814    Risk factors for vascular disease include hypercholesterolemia and hypertension  He reports excellent compliance with treatment. He is not having side effects.  Current symptoms include visual disturbances and have been patient reports new concern. Weight trend: stable Prior visit with dietician: no Current diet: well balanced Current exercise:  walking  Wt Readings from Last 3 Encounters:  12/21/18 195 lb (88.5 kg)  03/17/18 194 lb (88 kg)  11/28/17 191 lb (86.6 kg)    -------------------------------------------------------------------   Patient Active Problem List   Diagnosis Date Noted  . Hx of adenomatous polyp of colon 07/02/2016  . CKD (chronic kidney disease) stage 3, GFR 30-59 ml/min 01/06/2016  . Benign neoplasm of colon 03/04/2015  . ED (erectile dysfunction) of organic origin 03/04/2015  . HLD (hyperlipidemia) 03/04/2015  . Asthma, mild intermittent 03/04/2015  . Chronic systolic heart failure ([place]) 12/07/2013  . Arteriosclerosis of autologous vein coronary artery bypass graft 12/07/2013  . Benign essential HTN 01/07/2009  . Asthma, exogenous 02/26/2008  . CAD in native artery 01/05/2008    Past Medical History:  Diagnosis Date  . CAD (coronary artery disease)   . Hyperlipidemia   . Hypertension     Social History   Socioeconomic History  . Marital status: Married    Spouse name: Not on file  . Number of children: Not on file  . Years of education: Not on file  . Highest education level: Not on file  Occupational History  . Not on file  Social Needs  . Financial resource strain: Not on file  . Food insecurity    Worry: Not on file    Inability: Not on file  . Transportation needs    Medical: Not on file    Non-medical: Not on file  Tobacco Use  . Smoking status: Never Smoker  . Smokeless tobacco: Never Used  Substance and Sexual Activity  . Alcohol use: Yes    Alcohol/week: 0.0 standard drinks    Comment: occasionally   .  Drug use: No  . Sexual activity: Not on file  Lifestyle  . Physical activity    Days per week: Not on file    Minutes per session: Not on file  . Stress: Not on file  Relationships  . Social Herbalist on phone: Not on file    Gets together: Not on file    Attends religious service: Not on file    Active member of club or organization: Not on file     Attends meetings of clubs or organizations: Not on file    Relationship status: Not on file  . Intimate partner violence    Fear of current or ex partner: Not on file    Emotionally abused: Not on file    Physically abused: Not on file    Forced sexual activity: Not on file  Other Topics Concern  . Not on file  Social History Narrative  . Not on file    Outpatient Encounter Medications as of 12/21/2018  Medication Sig  . albuterol (VENTOLIN HFA) 108 (90 Base) MCG/ACT inhaler INHALE 1-2 PUFFS BY MOUTH 4 TIMES DAILY AS NEEDED  . aspirin 325 MG tablet 160 mg.   . carvedilol (COREG) 6.25 MG tablet Take 6.25 mg by mouth 2 (two) times daily.  Marland Kitchen lisinopril (PRINIVIL,ZESTRIL) 40 MG tablet Take 40 mg by mouth.   . triamterene-hydrochlorothiazide (MAXZIDE-25) 37.5-25 MG tablet 1 tablet daily  . hydrocortisone (ANUSOL-HC) 25 MG suppository Place 1 suppository (25 mg total) rectally 2 (two) times daily. (Patient not taking: Reported on 08/24/2018)  . rosuvastatin (CRESTOR) 10 MG tablet Take 1 tablet by mouth daily.  . [DISCONTINUED] amoxicillin-clavulanate (AUGMENTIN) 875-125 MG tablet Take 1 tablet by mouth 2 (two) times daily.  . [DISCONTINUED] azithromycin (ZITHROMAX) 250 MG tablet Take 2 tablets by mouth today then one daily for 4 days.  . [DISCONTINUED] predniSONE (DELTASONE) 10 MG tablet Taper down by one tablet daily starting with 6 tablets the first day (6,5,4,3,2,1)   No facility-administered encounter medications on file as of 12/21/2018.     No Known Allergies  Review of Systems  Constitutional: Negative.   HENT: Positive for hearing loss.        Wearing hearing aids bilaterally.  Eyes: Positive for blurred vision.  Respiratory: Negative.   Cardiovascular: Negative.   Gastrointestinal: Negative.   Genitourinary:       Decrease urine stream and nocturia once a night.  Skin:       Pink spot and slight tenderness along the right upper lip.    Objective:  BP 138/80   Pulse 60    Temp 97.7 F (36.5 C) (Oral)   Resp 16   Wt 195 lb (88.5 kg)   SpO2 97%   BMI 27.98 kg/m   Physical Exam  Constitutional: He is oriented to person, place, and time and well-developed, well-nourished, and in no distress.  HENT:  Head: Normocephalic.  Pink spot on the border of the right upper lip with tenderness in the right nostril.  Eyes: Conjunctivae are normal.  Neck: Neck supple.  Cardiovascular: Normal rate and regular rhythm.  Pulmonary/Chest: Effort normal.  Abdominal: Soft. Bowel sounds are normal.  Musculoskeletal: Normal range of motion.  Neurological: He is alert and oriented to person, place, and time.  Skin: No rash noted.  Psychiatric: Mood, affect and judgment normal.    Assessment and Plan :   1. Benign essential HTN BP in good control with Maxzide-25 qd, Carvedilol 6.25 mg BID  and Lisinopril 40 mg qd. No side effects. Will follow up with cardiologist in January 2021. Recheck routine labs and follow up pending reports.  2. Mixed hyperlipidemia Tolerating the Crestor without side effects and trying to follow a low fat diet. Continues to exercise regularly. Will recheck CMP, TSH and Lipid Panel. - Comprehensive Metabolic Panel (CMET) - Lipid Profile - TSH  3. Cold sore Small tender pink lesion on the right border of the upper lip and tenderness inside the nose over the past week. Suspect fever blister and will treat with Valtrex if recurs. May use Abreva to continue healing of this episode. Should use sunscreen to reduce risk of recurrences. - CBC with Differential - valACYclovir (VALTREX) 1000 MG tablet; Take 2 tablets by mouth twice a day for one day when cold sore occurs.  Dispense: 20 tablet; Refill: 0  4. Decreased urination Nocturia once a night when the dog gets up. No decrease in stream strength. No dysuria or hematuria. Will check CBC and PSA for signs of infection or prostate disease. - CBC with Differential - PSA  5. Blurring of vision Some  blurring of vision and dry sensation of eyes. May be related to chronic use of face mask. Worried about family history of diabetes and some changes in urinary frequency. Will check labs to rule out DM and may use Liquid Tears to moisturize eyes. - Comprehensive Metabolic Panel (CMET) - HgB A1c

## 2018-12-22 DIAGNOSIS — E782 Mixed hyperlipidemia: Secondary | ICD-10-CM | POA: Diagnosis not present

## 2018-12-22 DIAGNOSIS — B001 Herpesviral vesicular dermatitis: Secondary | ICD-10-CM | POA: Diagnosis not present

## 2018-12-22 DIAGNOSIS — R34 Anuria and oliguria: Secondary | ICD-10-CM | POA: Diagnosis not present

## 2018-12-22 DIAGNOSIS — H538 Other visual disturbances: Secondary | ICD-10-CM | POA: Diagnosis not present

## 2018-12-23 LAB — CBC WITH DIFFERENTIAL/PLATELET
Basophils Absolute: 0.1 10*3/uL (ref 0.0–0.2)
Basos: 1 %
EOS (ABSOLUTE): 0.5 10*3/uL — ABNORMAL HIGH (ref 0.0–0.4)
Eos: 8 %
Hematocrit: 40.7 % (ref 37.5–51.0)
Hemoglobin: 14.3 g/dL (ref 13.0–17.7)
Immature Grans (Abs): 0 10*3/uL (ref 0.0–0.1)
Immature Granulocytes: 0 %
Lymphocytes Absolute: 1.4 10*3/uL (ref 0.7–3.1)
Lymphs: 23 %
MCH: 32.4 pg (ref 26.6–33.0)
MCHC: 35.1 g/dL (ref 31.5–35.7)
MCV: 92 fL (ref 79–97)
Monocytes Absolute: 0.7 10*3/uL (ref 0.1–0.9)
Monocytes: 11 %
Neutrophils Absolute: 3.6 10*3/uL (ref 1.4–7.0)
Neutrophils: 57 %
Platelets: 175 10*3/uL (ref 150–450)
RBC: 4.42 x10E6/uL (ref 4.14–5.80)
RDW: 12.5 % (ref 11.6–15.4)
WBC: 6.3 10*3/uL (ref 3.4–10.8)

## 2018-12-23 LAB — COMPREHENSIVE METABOLIC PANEL
ALT: 26 IU/L (ref 0–44)
AST: 26 IU/L (ref 0–40)
Albumin/Globulin Ratio: 2.3 — ABNORMAL HIGH (ref 1.2–2.2)
Albumin: 4.3 g/dL (ref 3.8–4.8)
Alkaline Phosphatase: 93 IU/L (ref 39–117)
BUN/Creatinine Ratio: 15 (ref 10–24)
BUN: 21 mg/dL (ref 8–27)
Bilirubin Total: 1.4 mg/dL — ABNORMAL HIGH (ref 0.0–1.2)
CO2: 25 mmol/L (ref 20–29)
Calcium: 9.1 mg/dL (ref 8.6–10.2)
Chloride: 102 mmol/L (ref 96–106)
Creatinine, Ser: 1.37 mg/dL — ABNORMAL HIGH (ref 0.76–1.27)
GFR calc Af Amer: 61 mL/min/{1.73_m2} (ref >59)
GFR calc non Af Amer: 53 mL/min/{1.73_m2} — ABNORMAL LOW (ref >59)
Globulin, Total: 1.9 g/dL (ref 1.5–4.5)
Glucose: 102 mg/dL — ABNORMAL HIGH (ref 65–99)
Potassium: 4.2 mmol/L (ref 3.5–5.2)
Sodium: 142 mmol/L (ref 134–144)
Total Protein: 6.2 g/dL (ref 6.0–8.5)

## 2018-12-23 LAB — LIPID PANEL
Chol/HDL Ratio: 3.1 ratio (ref 0.0–5.0)
Cholesterol, Total: 127 mg/dL (ref 100–199)
HDL: 41 mg/dL (ref >39)
LDL Chol Calc (NIH): 71 mg/dL (ref 0–99)
Triglycerides: 72 mg/dL (ref 0–149)
VLDL Cholesterol Cal: 15 mg/dL (ref 5–40)

## 2018-12-23 LAB — PSA: Prostate Specific Ag, Serum: 0.4 ng/mL (ref 0.0–4.0)

## 2018-12-23 LAB — HEMOGLOBIN A1C
Est. average glucose Bld gHb Est-mCnc: 108 mg/dL
Hgb A1c MFr Bld: 5.4 % (ref 4.8–5.6)

## 2018-12-23 LAB — TSH: TSH: 2.21 u[IU]/mL (ref 0.450–4.500)

## 2018-12-25 DIAGNOSIS — I5022 Chronic systolic (congestive) heart failure: Secondary | ICD-10-CM | POA: Diagnosis not present

## 2018-12-25 DIAGNOSIS — I25711 Atherosclerosis of autologous vein coronary artery bypass graft(s) with angina pectoris with documented spasm: Secondary | ICD-10-CM | POA: Diagnosis not present

## 2018-12-25 DIAGNOSIS — I2510 Atherosclerotic heart disease of native coronary artery without angina pectoris: Secondary | ICD-10-CM | POA: Diagnosis not present

## 2018-12-25 DIAGNOSIS — I10 Essential (primary) hypertension: Secondary | ICD-10-CM | POA: Diagnosis not present

## 2019-01-30 DIAGNOSIS — Z85828 Personal history of other malignant neoplasm of skin: Secondary | ICD-10-CM | POA: Diagnosis not present

## 2019-01-30 DIAGNOSIS — L814 Other melanin hyperpigmentation: Secondary | ICD-10-CM | POA: Diagnosis not present

## 2019-01-30 DIAGNOSIS — D485 Neoplasm of uncertain behavior of skin: Secondary | ICD-10-CM | POA: Diagnosis not present

## 2019-01-30 DIAGNOSIS — L570 Actinic keratosis: Secondary | ICD-10-CM

## 2019-01-30 HISTORY — DX: Actinic keratosis: L57.0

## 2019-02-16 ENCOUNTER — Other Ambulatory Visit: Payer: Self-pay | Admitting: Family Medicine

## 2019-02-16 NOTE — Telephone Encounter (Signed)
Requested medication (s) are due for refill today: yes  Requested medication (s) are on the active medication list:yes  Last refill:  01/19/2019  Future visit scheduled: no  Notes to clinic: One inhaler should last at least one month. If the patient is requesting refills earlier, contact the patient to check for uncontrolled symptoms   Requested Prescriptions  Pending Prescriptions Disp Refills   albuterol (VENTOLIN HFA) 108 (90 Base) MCG/ACT inhaler [Pharmacy Med Name: ALBUTEROL HFA (PROVENTIL) INH]  2    Sig: INHALE 1-2 PUFFS BY MOUTH 4 TIMES DAILY AS NEEDED      Pulmonology:  Beta Agonists Failed - 02/16/2019  1:25 AM      Failed - One inhaler should last at least one month. If the patient is requesting refills earlier, contact the patient to check for uncontrolled symptoms.      Passed - Valid encounter within last 12 months    Recent Outpatient Visits           1 month ago Benign essential HTN   [place], Vickki Muff, [place]   5 months ago Cough   [facility], Vickki Muff, [place]   11 months ago Annual physical exam   [place], [place]   1 year ago Internal hemorrhoid, bleeding   [facility], [place], [place]   1 year ago Benign essential HTN   [place], [place], [place]

## 2019-03-05 DIAGNOSIS — L578 Other skin changes due to chronic exposure to nonionizing radiation: Secondary | ICD-10-CM | POA: Diagnosis not present

## 2019-03-05 DIAGNOSIS — L570 Actinic keratosis: Secondary | ICD-10-CM | POA: Diagnosis not present

## 2019-03-05 DIAGNOSIS — L821 Other seborrheic keratosis: Secondary | ICD-10-CM | POA: Diagnosis not present

## 2019-03-22 DIAGNOSIS — Z03818 Encounter for observation for suspected exposure to other biological agents ruled out: Secondary | ICD-10-CM | POA: Diagnosis not present

## 2019-03-22 DIAGNOSIS — Z20828 Contact with and (suspected) exposure to other viral communicable diseases: Secondary | ICD-10-CM | POA: Diagnosis not present

## 2019-04-06 DIAGNOSIS — Z20828 Contact with and (suspected) exposure to other viral communicable diseases: Secondary | ICD-10-CM | POA: Diagnosis not present

## 2019-04-23 ENCOUNTER — Ambulatory Visit: Payer: BC Managed Care – PPO

## 2019-04-29 ENCOUNTER — Other Ambulatory Visit: Payer: Self-pay | Admitting: Family Medicine

## 2019-04-29 NOTE — Telephone Encounter (Signed)
Requested Prescriptions  Pending Prescriptions Disp Refills  . albuterol (VENTOLIN HFA) 108 (90 Base) MCG/ACT inhaler [Pharmacy Med Name: ALBUTEROL HFA (PROAIR) INHALER] 8.5 g 2    Sig: INHALE 1-2 PUFFS BY MOUTH 4 TIMES DAILY AS NEEDED     Pulmonology:  Beta Agonists Failed - 04/29/2019  9:44 AM      Failed - One inhaler should last at least one month. If the patient is requesting refills earlier, contact the patient to check for uncontrolled symptoms.      Passed - Valid encounter within last 12 months    Recent Outpatient Visits          4 months ago Benign essential HTN   [place], Vickki Muff, [place]   8 months ago Cough   [facility], Vickki Muff, [place]   1 year ago Annual physical exam   [place], [place]   1 year ago Internal hemorrhoid, bleeding   [facility], [place], [place]   1 year ago Benign essential HTN   [place], [place], [place]

## 2019-05-09 ENCOUNTER — Telehealth: Payer: Self-pay

## 2019-05-09 NOTE — Telephone Encounter (Signed)
Copied from [place] [id number]. Topic: General - Call Back - No Documentation >> May 09, 2019  2:57 PM Erick Blinks wrote: Reason for CRM: Pt has questions concerning his covid 19 vaccine, Levan Hurst) he is scheduled for his 2nd dose on the 21st.  Best contact: [phone]

## 2019-05-10 NOTE — Telephone Encounter (Signed)
I called and spoke with patient. He wants to know what Simona Huh thinks about the COVID vaccines. Patient has already had one dose of Moderna and is scheduled to get his second dose on 05/23/2019. Patient states there is so much controversy about this vaccine, he just would like to know Dennis's opinion. Patient has an appointment for a CPE on 05/17/2019. He states he will wait until the physical appointment to discuss this topic.

## 2019-05-10 NOTE — Telephone Encounter (Signed)
Acknowledged.

## 2019-05-14 ENCOUNTER — Other Ambulatory Visit: Payer: Self-pay

## 2019-05-14 DIAGNOSIS — I10 Essential (primary) hypertension: Secondary | ICD-10-CM

## 2019-05-14 DIAGNOSIS — E782 Mixed hyperlipidemia: Secondary | ICD-10-CM

## 2019-05-14 DIAGNOSIS — I25810 Atherosclerosis of coronary artery bypass graft(s) without angina pectoris: Secondary | ICD-10-CM

## 2019-05-16 NOTE — Progress Notes (Signed)
Complete physical exam   I,[name],acting as a scribe for [employer], [employer].,have documented all relevant documentation on the behalf of Joseph Murders, PA,as directed by  [employer], PA while in the presence of [employer], [place].     Patient: Joseph Cooper   DOB: 1950/08/23   69 y.o. Male  MRN: [id number] Visit Date: 05/17/2019  Today's healthcare provider: [name], PA  Subjective:    Chief Complaint  Patient presents with  . Annual Exam    Joseph Cooper is a 69 y.o. male who presents today for a complete physical exam.    Past Medical History:  Diagnosis Date  . CAD (coronary artery disease)   . Hyperlipidemia   . Hypertension    Past Surgical History:  Procedure Laterality Date  . COLONOSCOPY WITH PROPOFOL N/A 11/03/2016   Procedure: COLONOSCOPY WITH PROPOFOL;  Surgeon: Manya Silvas, MD;  Location: [facility] ENDOSCOPY;  Service: Endoscopy;  Laterality: N/A;  . CORONARY ARTERY BYPASS GRAFT    . TONSILLECTOMY AND ADENOIDECTOMY    . VASECTOMY     Social History   Socioeconomic History  . Marital status: Married    Spouse name: Not on file  . Number of children: Not on file  . Years of education: Not on file  . Highest education level: Not on file  Occupational History  . Not on file  Tobacco Use  . Smoking status: Never Smoker  . Smokeless tobacco: Never Used  Substance and Sexual Activity  . Alcohol use: Yes    Alcohol/week: 0.0 standard drinks    Comment: occasionally   . Drug use: No  . Sexual activity: Not on file  Other Topics Concern  . Not on file  Social History Narrative  . Not on file   Social Determinants of Health   Financial Resource Strain:   . Difficulty of Paying Living Expenses:   Food Insecurity:   . Worried About Charity fundraiser in the Last Year:   . Arboriculturist in the Last Year:   Transportation Needs:   . Film/video editor (Medical):   Marland Kitchen Lack of Transportation (Non-Medical):     Physical Activity:   . Days of Exercise per Week:   . Minutes of Exercise per Session:   Stress:   . Feeling of Stress :   Social Connections:   . Frequency of Communication with Friends and Family:   . Frequency of Social Gatherings with Friends and Family:   . Attends Religious Services:   . Active Member of Clubs or Organizations:   . Attends Archivist Meetings:   Marland Kitchen Marital Status:   Intimate Partner Violence:   . Fear of Current or Ex-Partner:   . Emotionally Abused:   Marland Kitchen Physically Abused:   . Sexually Abused:    Family Status  Relation Name Status  . Mother  Alive  . Father  Deceased at age 90  . Sister  Deceased  . Daughter 1 Alive  . MGM  Deceased  . MGF  Deceased at age 72  . PGM  Deceased  . PGF  Deceased  . Daughter 2 Alive   Family History  Problem Relation Age of Onset  . Healthy Mother   . Stroke Father   . CAD Father   . Heart disease Father   . Diabetes Sister   . Heart attack Sister   . Healthy Daughter   . Heart attack Maternal Grandmother   .  Heart attack Maternal Grandfather   . Heart attack Paternal Grandfather   . Healthy Daughter    No Known Allergies  Patient Care Team: Lexany Belknap, Vickki Muff, PA as PCP - General (Family Medicine)   Medications: Outpatient Medications Prior to Visit  Medication Sig  . albuterol (VENTOLIN HFA) 108 (90 Base) MCG/ACT inhaler INHALE 1-2 PUFFS BY MOUTH 4 TIMES DAILY AS NEEDED  . aspirin EC 81 MG tablet Take 81 mg by mouth daily.  . carvedilol (COREG) 6.25 MG tablet Take 6.25 mg by mouth 2 (two) times daily.  Marland Kitchen lisinopril (PRINIVIL,ZESTRIL) 40 MG tablet Take 40 mg by mouth.   . triamterene-hydrochlorothiazide (MAXZIDE-25) 37.5-25 MG tablet 1 tablet daily  . valACYclovir (VALTREX) 1000 MG tablet Take 2 tablets by mouth twice a day for one day when cold sore occurs.  . hydrocortisone (ANUSOL-HC) 25 MG suppository Place 1 suppository (25 mg total) rectally 2 (two) times daily. (Patient not taking:  Reported on 08/24/2018)  . rosuvastatin (CRESTOR) 10 MG tablet Take 1 tablet by mouth daily.  . [DISCONTINUED] aspirin 325 MG tablet 160 mg.    No facility-administered medications prior to visit.    Review of Systems  Constitutional: Negative.   HENT: Negative.   Eyes: Negative.   Respiratory: Negative.   Cardiovascular: Negative.   Gastrointestinal: Negative.   Endocrine: Negative.   Genitourinary: Negative.   Musculoskeletal: Negative.   Skin: Negative.   Allergic/Immunologic: Negative.   Neurological: Negative.   Hematological: Negative.   Psychiatric/Behavioral: Negative.         Objective:    BP 130/80   Pulse (!) 58   Temp (!) 96.6 F (35.9 C)   Wt 199 lb (90.3 kg)   SpO2 98%   BMI 28.55 kg/m    Physical Exam Constitutional:      Appearance: Normal appearance. He is normal weight.  HENT:     Head: Normocephalic and atraumatic.     Right Ear: Tympanic membrane, ear canal and external ear normal.     Left Ear: Tympanic membrane, ear canal and external ear normal.     Nose: Nose normal.     Mouth/Throat:     Mouth: Mucous membranes are moist.     Pharynx: Oropharynx is clear.  Eyes:     Extraocular Movements: Extraocular movements intact.     Conjunctiva/sclera: Conjunctivae normal.     Pupils: Pupils are equal, round, and reactive to light.  Cardiovascular:     Rate and Rhythm: Normal rate and regular rhythm.     Pulses: Normal pulses.     Heart sounds: Normal heart sounds.  Pulmonary:     Effort: Pulmonary effort is normal.     Breath sounds: Normal breath sounds.  Abdominal:     General: Abdomen is flat.     Palpations: Abdomen is soft.  Genitourinary:    Penis: Normal.      Testes: Normal.     Prostate: Normal.     Rectum: Normal. Guaiac result negative.  Musculoskeletal:        General: Normal range of motion.     Cervical back: Normal range of motion and neck supple.  Skin:    General: Skin is warm and dry.  Neurological:     General:  No focal deficit present.     Mental Status: He is alert and oriented to person, place, and time. Mental status is at baseline.  Psychiatric:        Mood and Affect: Mood normal.  Behavior: Behavior normal.        Thought Content: Thought content normal.        Judgment: Judgment normal.     Fall Risk  03/17/2018 03/14/2017  Falls in the past year? 0 No  Injury with Fall? 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     Immunization History  Administered Date(s) Administered  . Influenza Split 12/27/2011, 12/08/2012  . Influenza, High Dose Seasonal PF 11/06/2016, 03/17/2018  . Influenza,inj,Quad PF,6+ Mos 11/14/2013  . Pneumococcal Conjugate-13 03/14/2017  . Pneumococcal Polysaccharide-23 02/15/2012, 03/17/2018  . Tdap 02/15/2012  . Zoster 02/15/2012    Health Maintenance  Topic Date Due  . INFLUENZA VACCINE  09/02/2019  . TETANUS/TDAP  02/14/2022  . COLONOSCOPY  11/16/2026  . Hepatitis C Screening  Completed  . PNA vac Low Risk Adult  Completed    Discussed health benefits of physical activity, and encouraged him to engage in regular exercise appropriate for his age and condition.   1. Annual physical exam General health stable with history of NYHA Class II CHF. Immunizations up to date. Will get second Wallace vaccination in a week or two. Given anticipatory counseling. May be  contacted by GI this year for follow up colonoscopy with history of adenomatous polyps.  2. Mixed hyperlipidemia Following low fat diet and maintaining weight stability. No side effects from Zetia 10 mg qd prescribed by Dr. Nehemiah Massed (cardiologist). Recheck labs drawn today.  3. Coronary atherosclerosis of autologous vein bypass graft without angina History of 5 vessel CABG in 2009. No recent chest pains, palpitations or dyspnea. Followed by Dr. Nehemiah Massed.  4. Benign essential HTN Well controlled. Tolerating the Maxzide 25 mg qd, Lisinopril 40 mg qd and Carvedilol 6.25 mg BID. Had fasting labs drawn this morning. Will follow up pending reports.  5. Chronic systolic heart failure (HCC) LVEF 45% on 06-14-18. Scheduled for follow up with Dr. Nehemiah Massed (cardiologist) on 06-25-19. No edema, dyspnea or chest discomfort.  6. Hx of adenomatous polyp of colon Two tubular adenomatous polyps on colonoscopy by Dr. Vira Agar (GI) in 2018. Benign lesions per pathology report.     Andres Shad, PA, have reviewed all documentation for this visit. The documentation on 05/17/19 for the exam, diagnosis, procedures, and orders are all accurate and complete.    Joseph Cooper, [place] [phone] (phone) [phone] (fax)  [place]

## 2019-05-17 ENCOUNTER — Ambulatory Visit (INDEPENDENT_AMBULATORY_CARE_PROVIDER_SITE_OTHER): Payer: BC Managed Care – PPO | Admitting: Family Medicine

## 2019-05-17 ENCOUNTER — Encounter: Payer: Self-pay | Admitting: Family Medicine

## 2019-05-17 ENCOUNTER — Ambulatory Visit: Payer: BC Managed Care – PPO | Admitting: Family Medicine

## 2019-05-17 ENCOUNTER — Other Ambulatory Visit: Payer: Self-pay

## 2019-05-17 VITALS — BP 130/80 | HR 58 | Temp 96.6°F | Wt 199.0 lb

## 2019-05-17 DIAGNOSIS — I10 Essential (primary) hypertension: Secondary | ICD-10-CM

## 2019-05-17 DIAGNOSIS — I5022 Chronic systolic (congestive) heart failure: Secondary | ICD-10-CM

## 2019-05-17 DIAGNOSIS — Z0000 Encounter for general adult medical examination without abnormal findings: Secondary | ICD-10-CM | POA: Diagnosis not present

## 2019-05-17 DIAGNOSIS — I25810 Atherosclerosis of coronary artery bypass graft(s) without angina pectoris: Secondary | ICD-10-CM | POA: Diagnosis not present

## 2019-05-17 DIAGNOSIS — E782 Mixed hyperlipidemia: Secondary | ICD-10-CM

## 2019-05-17 DIAGNOSIS — Z860101 Personal history of adenomatous and serrated colon polyps: Secondary | ICD-10-CM

## 2019-05-17 DIAGNOSIS — Z86010 Personal history of colonic polyps: Secondary | ICD-10-CM

## 2019-05-17 NOTE — Patient Instructions (Signed)
DASH Eating Plan DASH stands for "Dietary Approaches to Stop Hypertension." The DASH eating plan is a healthy eating plan that has been shown to reduce high blood pressure (hypertension). It may also reduce your risk for type 2 diabetes, heart disease, and stroke. The DASH eating plan may also help with weight loss. What are tips for following this plan?  General guidelines  Avoid eating more than 2,300 mg (milligrams) of salt (sodium) a day. If you have hypertension, you may need to reduce your sodium intake to 1,500 mg a day.  Limit alcohol intake to no more than 1 drink a day for nonpregnant women and 2 drinks a day for men. One drink equals 12 oz of beer, 5 oz of wine, or 1 oz of hard liquor.  Work with your health care provider to maintain a healthy body weight or to lose weight. Ask what an ideal weight is for you.  Get at least 30 minutes of exercise that causes your heart to beat faster (aerobic exercise) most days of the week. Activities may include walking, swimming, or biking.  Work with your health care provider or diet and nutrition specialist (dietitian) to adjust your eating plan to your individual calorie needs. Reading food labels   Check food labels for the amount of sodium per serving. Choose foods with less than 5 percent of the Daily Value of sodium. Generally, foods with less than 300 mg of sodium per serving fit into this eating plan.  To find whole grains, look for the word "whole" as the first word in the ingredient list. Shopping  Buy products labeled as "low-sodium" or "no salt added."  Buy fresh foods. Avoid canned foods and premade or frozen meals. Cooking  Avoid adding salt when cooking. Use salt-free seasonings or herbs instead of table salt or sea salt. Check with your health care provider or pharmacist before using salt substitutes.  Do not fry foods. Cook foods using healthy methods such as baking, boiling, grilling, and broiling instead.  Cook with  heart-healthy oils, such as olive, canola, soybean, or sunflower oil. Meal planning  Eat a balanced diet that includes: ? 5 or more servings of fruits and vegetables each day. At each meal, try to fill half of your plate with fruits and vegetables. ? Up to 6-8 servings of whole grains each day. ? Less than 6 oz of lean meat, poultry, or fish each day. A 3-oz serving of meat is about the same size as a deck of cards. One egg equals 1 oz. ? 2 servings of low-fat dairy each day. ? A serving of nuts, seeds, or beans 5 times each week. ? Heart-healthy fats. Healthy fats called Omega-3 fatty acids are found in foods such as flaxseeds and coldwater fish, like sardines, salmon, and mackerel.  Limit how much you eat of the following: ? Canned or prepackaged foods. ? Food that is high in trans fat, such as fried foods. ? Food that is high in saturated fat, such as fatty meat. ? Sweets, desserts, sugary drinks, and other foods with added sugar. ? Full-fat dairy products.  Do not salt foods before eating.  Try to eat at least 2 vegetarian meals each week.  Eat more home-cooked food and less restaurant, buffet, and fast food.  When eating at a restaurant, ask that your food be prepared with less salt or no salt, if possible. What foods are recommended? The items listed may not be a complete list. Talk with your dietitian about   what dietary choices are best for you. Grains Whole-grain or whole-wheat bread. Whole-grain or whole-wheat pasta. Brown rice. Oatmeal. Quinoa. Bulgur. Whole-grain and low-sodium cereals. Pita bread. Low-fat, low-sodium crackers. Whole-wheat flour tortillas. Vegetables Fresh or frozen vegetables (raw, steamed, roasted, or grilled). Low-sodium or reduced-sodium tomato and vegetable juice. Low-sodium or reduced-sodium tomato sauce and tomato paste. Low-sodium or reduced-sodium canned vegetables. Fruits All fresh, dried, or frozen fruit. Canned fruit in natural juice (without  added sugar). Meat and other protein foods Skinless chicken or turkey. Ground chicken or turkey. Pork with fat trimmed off. Fish and seafood. Egg whites. Dried beans, peas, or lentils. Unsalted nuts, nut butters, and seeds. Unsalted canned beans. Lean cuts of beef with fat trimmed off. Low-sodium, lean deli meat. Dairy Low-fat (1%) or fat-free (skim) milk. Fat-free, low-fat, or reduced-fat cheeses. Nonfat, low-sodium ricotta or cottage cheese. Low-fat or nonfat yogurt. Low-fat, low-sodium cheese. Fats and oils Soft margarine without trans fats. Vegetable oil. Low-fat, reduced-fat, or light mayonnaise and salad dressings (reduced-sodium). Canola, safflower, olive, soybean, and sunflower oils. Avocado. Seasoning and other foods Herbs. Spices. Seasoning mixes without salt. Unsalted popcorn and pretzels. Fat-free sweets. What foods are not recommended? The items listed may not be a complete list. Talk with your dietitian about what dietary choices are best for you. Grains Baked goods made with fat, such as croissants, muffins, or some breads. Dry pasta or rice meal packs. Vegetables Creamed or fried vegetables. Vegetables in a cheese sauce. Regular canned vegetables (not low-sodium or reduced-sodium). Regular canned tomato sauce and paste (not low-sodium or reduced-sodium). Regular tomato and vegetable juice (not low-sodium or reduced-sodium). Pickles. Olives. Fruits Canned fruit in a light or heavy syrup. Fried fruit. Fruit in cream or butter sauce. Meat and other protein foods Fatty cuts of meat. Ribs. Fried meat. Bacon. Sausage. Bologna and other processed lunch meats. Salami. Fatback. Hotdogs. Bratwurst. Salted nuts and seeds. Canned beans with added salt. Canned or smoked fish. Whole eggs or egg yolks. Chicken or turkey with skin. Dairy Whole or 2% milk, cream, and half-and-half. Whole or full-fat cream cheese. Whole-fat or sweetened yogurt. Full-fat cheese. Nondairy creamers. Whipped toppings.  Processed cheese and cheese spreads. Fats and oils Butter. Stick margarine. Lard. Shortening. Ghee. Bacon fat. Tropical oils, such as coconut, palm kernel, or palm oil. Seasoning and other foods Salted popcorn and pretzels. Onion salt, garlic salt, seasoned salt, table salt, and sea salt. Worcestershire sauce. Tartar sauce. Barbecue sauce. Teriyaki sauce. Soy sauce, including reduced-sodium. Steak sauce. Canned and packaged gravies. Fish sauce. Oyster sauce. Cocktail sauce. Horseradish that you find on the shelf. Ketchup. Mustard. Meat flavorings and tenderizers. Bouillon cubes. Hot sauce and Tabasco sauce. Premade or packaged marinades. Premade or packaged taco seasonings. Relishes. Regular salad dressings. Where to find more information:  National Heart, Lung, and Blood Institute: www.nhlbi.nih.gov  American Heart Association: www.heart.org Summary  The DASH eating plan is a healthy eating plan that has been shown to reduce high blood pressure (hypertension). It may also reduce your risk for type 2 diabetes, heart disease, and stroke.  With the DASH eating plan, you should limit salt (sodium) intake to 2,300 mg a day. If you have hypertension, you may need to reduce your sodium intake to 1,500 mg a day.  When on the DASH eating plan, aim to eat more fresh fruits and vegetables, whole grains, lean proteins, low-fat dairy, and heart-healthy fats.  Work with your health care provider or diet and nutrition specialist (dietitian) to adjust your eating plan to your   individual calorie needs. This information is not intended to replace advice given to you by your health care provider. Make sure you discuss any questions you have with your health care provider. Document Revised: 12/31/2016 Document Reviewed: 01/12/2016 Elsevier Patient Education  2020 Elsevier Inc.  

## 2019-05-18 LAB — CBC WITH DIFFERENTIAL/PLATELET
Basophils Absolute: 0.1 10*3/uL (ref 0.0–0.2)
Basos: 1 %
EOS (ABSOLUTE): 0.7 10*3/uL — ABNORMAL HIGH (ref 0.0–0.4)
Eos: 10 %
Hematocrit: 40 % (ref 37.5–51.0)
Hemoglobin: 14 g/dL (ref 13.0–17.7)
Immature Grans (Abs): 0 10*3/uL (ref 0.0–0.1)
Immature Granulocytes: 0 %
Lymphocytes Absolute: 1.6 10*3/uL (ref 0.7–3.1)
Lymphs: 23 %
MCH: 32.8 pg (ref 26.6–33.0)
MCHC: 35 g/dL (ref 31.5–35.7)
MCV: 94 fL (ref 79–97)
Monocytes Absolute: 0.7 10*3/uL (ref 0.1–0.9)
Monocytes: 10 %
Neutrophils Absolute: 4 10*3/uL (ref 1.4–7.0)
Neutrophils: 56 %
Platelets: 171 10*3/uL (ref 150–450)
RBC: 4.27 x10E6/uL (ref 4.14–5.80)
RDW: 12.6 % (ref 11.6–15.4)
WBC: 7.1 10*3/uL (ref 3.4–10.8)

## 2019-05-18 LAB — COMPREHENSIVE METABOLIC PANEL
ALT: 40 IU/L (ref 0–44)
AST: 31 IU/L (ref 0–40)
Albumin/Globulin Ratio: 2.4 — ABNORMAL HIGH (ref 1.2–2.2)
Albumin: 4.3 g/dL (ref 3.8–4.8)
Alkaline Phosphatase: 107 IU/L (ref 39–117)
BUN/Creatinine Ratio: 16 (ref 10–24)
BUN: 20 mg/dL (ref 8–27)
Bilirubin Total: 0.9 mg/dL (ref 0.0–1.2)
CO2: 26 mmol/L (ref 20–29)
Calcium: 9 mg/dL (ref 8.6–10.2)
Chloride: 104 mmol/L (ref 96–106)
Creatinine, Ser: 1.29 mg/dL — ABNORMAL HIGH (ref 0.76–1.27)
GFR calc Af Amer: 66 mL/min/{1.73_m2} (ref >59)
GFR calc non Af Amer: 57 mL/min/{1.73_m2} — ABNORMAL LOW (ref >59)
Globulin, Total: 1.8 g/dL (ref 1.5–4.5)
Glucose: 95 mg/dL (ref 65–99)
Potassium: 4 mmol/L (ref 3.5–5.2)
Sodium: 140 mmol/L (ref 134–144)
Total Protein: 6.1 g/dL (ref 6.0–8.5)

## 2019-05-18 LAB — LIPID PANEL WITH LDL/HDL RATIO
Cholesterol, Total: 127 mg/dL (ref 100–199)
HDL: 42 mg/dL (ref >39)
LDL Chol Calc (NIH): 65 mg/dL (ref 0–99)
LDL/HDL Ratio: 1.5 ratio (ref 0.0–3.6)
Triglycerides: 111 mg/dL (ref 0–149)
VLDL Cholesterol Cal: 20 mg/dL (ref 5–40)

## 2019-05-18 LAB — TSH: TSH: 2.37 u[IU]/mL (ref 0.450–4.500)

## 2019-06-25 DIAGNOSIS — I2510 Atherosclerotic heart disease of native coronary artery without angina pectoris: Secondary | ICD-10-CM | POA: Diagnosis not present

## 2019-06-25 DIAGNOSIS — I5022 Chronic systolic (congestive) heart failure: Secondary | ICD-10-CM | POA: Diagnosis not present

## 2019-06-25 DIAGNOSIS — I25711 Atherosclerosis of autologous vein coronary artery bypass graft(s) with angina pectoris with documented spasm: Secondary | ICD-10-CM | POA: Diagnosis not present

## 2019-06-25 DIAGNOSIS — I10 Essential (primary) hypertension: Secondary | ICD-10-CM | POA: Diagnosis not present

## 2019-07-07 ENCOUNTER — Other Ambulatory Visit: Payer: Self-pay | Admitting: Family Medicine

## 2019-07-07 NOTE — Telephone Encounter (Signed)
Requested Prescriptions  Pending Prescriptions Disp Refills  . albuterol (VENTOLIN HFA) 108 (90 Base) MCG/ACT inhaler [Pharmacy Med Name: ALBUTEROL HFA (PROAIR) INHALER] 8.5 g 2    Sig: INHALE 1-2 PUFFS BY MOUTH 4 TIMES DAILY AS NEEDED     Pulmonology:  Beta Agonists Failed - 07/07/2019  9:31 AM      Failed - One inhaler should last at least one month. If the patient is requesting refills earlier, contact the patient to check for uncontrolled symptoms.      Passed - Valid encounter within last 12 months    Recent Outpatient Visits          1 month ago Annual physical exam   [place], [place]   6 months ago Benign essential HTN   [facility], Vickki Muff, [place]   10 months ago Cough   [facility], Vickki Muff, [place]   1 year ago Annual physical exam   [place], [place]   1 year ago Internal hemorrhoid, bleeding   [facility], [place], [place]

## 2019-09-20 DIAGNOSIS — K219 Gastro-esophageal reflux disease without esophagitis: Secondary | ICD-10-CM | POA: Diagnosis not present

## 2020-01-01 DIAGNOSIS — I5022 Chronic systolic (congestive) heart failure: Secondary | ICD-10-CM | POA: Diagnosis not present

## 2020-01-01 DIAGNOSIS — I25711 Atherosclerosis of autologous vein coronary artery bypass graft(s) with angina pectoris with documented spasm: Secondary | ICD-10-CM | POA: Diagnosis not present

## 2020-01-01 DIAGNOSIS — I6523 Occlusion and stenosis of bilateral carotid arteries: Secondary | ICD-10-CM | POA: Diagnosis not present

## 2020-01-01 DIAGNOSIS — I10 Essential (primary) hypertension: Secondary | ICD-10-CM | POA: Diagnosis not present

## 2020-01-01 DIAGNOSIS — E782 Mixed hyperlipidemia: Secondary | ICD-10-CM | POA: Diagnosis not present

## 2020-01-09 ENCOUNTER — Ambulatory Visit: Payer: BC Managed Care – PPO

## 2020-02-12 DIAGNOSIS — Z20822 Contact with and (suspected) exposure to covid-19: Secondary | ICD-10-CM | POA: Diagnosis not present

## 2020-02-29 ENCOUNTER — Other Ambulatory Visit: Payer: Self-pay | Admitting: Family Medicine

## 2020-03-13 DIAGNOSIS — J4520 Mild intermittent asthma, uncomplicated: Secondary | ICD-10-CM | POA: Diagnosis not present

## 2020-03-19 DIAGNOSIS — H04129 Dry eye syndrome of unspecified lacrimal gland: Secondary | ICD-10-CM | POA: Diagnosis not present

## 2020-03-19 DIAGNOSIS — H02839 Dermatochalasis of unspecified eye, unspecified eyelid: Secondary | ICD-10-CM | POA: Diagnosis not present

## 2020-03-19 DIAGNOSIS — H5203 Hypermetropia, bilateral: Secondary | ICD-10-CM | POA: Diagnosis not present

## 2020-03-19 DIAGNOSIS — H02834 Dermatochalasis of left upper eyelid: Secondary | ICD-10-CM | POA: Diagnosis not present

## 2020-03-19 DIAGNOSIS — H1045 Other chronic allergic conjunctivitis: Secondary | ICD-10-CM | POA: Diagnosis not present

## 2020-04-11 ENCOUNTER — Telehealth: Payer: Self-pay

## 2020-04-11 NOTE — Telephone Encounter (Signed)
Pt. States he has not had a flu shot last year. Asking if he can still get one. States he will be traveling this month. Instructed he can get one. Offered to make appointment in the practice. Declines. States he will get it at his pharmacy.

## 2020-05-19 ENCOUNTER — Encounter: Payer: PPO | Admitting: Family Medicine

## 2020-05-21 ENCOUNTER — Other Ambulatory Visit: Payer: Self-pay

## 2020-05-21 ENCOUNTER — Ambulatory Visit: Payer: PPO | Admitting: Dermatology

## 2020-05-21 DIAGNOSIS — Z85828 Personal history of other malignant neoplasm of skin: Secondary | ICD-10-CM | POA: Diagnosis not present

## 2020-05-21 DIAGNOSIS — L814 Other melanin hyperpigmentation: Secondary | ICD-10-CM | POA: Diagnosis not present

## 2020-05-21 DIAGNOSIS — L821 Other seborrheic keratosis: Secondary | ICD-10-CM | POA: Diagnosis not present

## 2020-05-21 DIAGNOSIS — L578 Other skin changes due to chronic exposure to nonionizing radiation: Secondary | ICD-10-CM

## 2020-05-21 DIAGNOSIS — L570 Actinic keratosis: Secondary | ICD-10-CM

## 2020-05-21 DIAGNOSIS — Z1283 Encounter for screening for malignant neoplasm of skin: Secondary | ICD-10-CM | POA: Diagnosis not present

## 2020-05-21 DIAGNOSIS — D229 Melanocytic nevi, unspecified: Secondary | ICD-10-CM | POA: Diagnosis not present

## 2020-05-21 DIAGNOSIS — L820 Inflamed seborrheic keratosis: Secondary | ICD-10-CM | POA: Diagnosis not present

## 2020-05-21 DIAGNOSIS — D1800 Hemangioma unspecified site: Secondary | ICD-10-CM | POA: Diagnosis not present

## 2020-05-21 NOTE — Progress Notes (Signed)
Follow-Up Visit   Subjective  Joseph Cooper is a 70 y.o. male who presents for the following: Annual Exam (History of BCC - TBSE today) and Other (Itchy spots of chest and scalp). The patient presents for Total-Body Skin Exam (TBSE) for skin cancer screening and mole check.  The following portions of the chart were reviewed this encounter and updated as appropriate:   Tobacco  Allergies  Meds  Problems  Med Hx  Surg Hx  Fam Hx     Review of Systems:  No other skin or systemic complaints except as noted in HPI or Assessment and Plan.  Objective  Well appearing patient in no apparent distress; mood and affect are within normal limits.  A full examination was performed including scalp, head, eyes, ears, nose, lips, neck, chest, axillae, abdomen, back, buttocks, bilateral upper extremities, bilateral lower extremities, hands, feet, fingers, toes, fingernails, and toenails. All findings within normal limits unless otherwise noted below.  Objective  Scalp/face (9): Erythematous thin papules/macules with gritty scale.   Objective  Left Shoulder x 1, right side x 1, right chest x 1, left thigh x 3 (6): Erythematous keratotic or waxy stuck-on papule or plaque.    Assessment & Plan    History of Basal Cell Carcinoma of the Skin - No evidence of recurrence today - Recommend regular full body skin exams - Recommend daily broad spectrum sunscreen SPF 30+ to sun-exposed areas, reapply every 2 hours as needed.  - Call if any new or changing lesions are noted between office visits  Lentigines - Scattered tan macules - Due to sun exposure - Benign-appering, observe - Recommend daily broad spectrum sunscreen SPF 30+ to sun-exposed areas, reapply every 2 hours as needed. - Call for any changes  Seborrheic Keratoses - Stuck-on, waxy, tan-brown papules and/or plaques  - Benign-appearing - Discussed benign etiology and prognosis. - Observe - Call for any changes  Melanocytic  [place] - Tan-brown and/or pink-flesh-colored symmetric macules and papules - Benign appearing on exam today - Observation - Call clinic for new or changing moles - Recommend daily use of broad spectrum spf 30+ sunscreen to sun-exposed areas.   Hemangiomas - Red papules - Discussed benign nature - Observe - Call for any changes  Actinic Damage - Chronic condition, secondary to cumulative UV/sun exposure - diffuse scaly erythematous macules with underlying dyspigmentation - Recommend daily broad spectrum sunscreen SPF 30+ to sun-exposed areas, reapply every 2 hours as needed.  - Staying in the shade or wearing long sleeves, sun glasses (UVA+UVB protection) and wide brim hats (4-inch brim around the entire circumference of the hat) are also recommended for sun protection.  - Call for new or changing lesions.  Skin cancer screening performed today.  AK (actinic keratosis) (9) Scalp/face  Destruction of lesion - Scalp/face Complexity: simple   Destruction method: cryotherapy   Informed consent: discussed and consent obtained   Timeout:  patient name, date of birth, surgical site, and procedure verified Lesion destroyed using liquid nitrogen: Yes   Region frozen until ice ball extended beyond lesion: Yes   Outcome: patient tolerated procedure well with no complications   Post-procedure details: wound care instructions given    Inflamed seborrheic keratosis (6) Left Shoulder x 1, right side x 1, right chest x 1, left thigh x 3  Destruction of lesion - Left Shoulder x 1, right side x 1, right chest x 1, left thigh x 3 Complexity: simple   Destruction method: cryotherapy   Informed consent:  discussed and consent obtained   Timeout:  patient name, date of birth, surgical site, and procedure verified Lesion destroyed using liquid nitrogen: Yes   Region frozen until ice ball extended beyond lesion: Yes   Outcome: patient tolerated procedure well with no complications   Post-procedure  details: wound care instructions given    Skin cancer screening  Return in about 1 year (around 05/21/2021) for TBSE.   I, Ashok Cordia, CMA, am acting as scribe for Sarina Ser, MD .  Documentation: I have reviewed the above documentation for accuracy and completeness, and I agree with the above.  Sarina Ser, MD

## 2020-05-21 NOTE — Patient Instructions (Signed)

## 2020-05-23 ENCOUNTER — Encounter: Payer: Self-pay | Admitting: Dermatology

## 2020-06-16 DIAGNOSIS — I10 Essential (primary) hypertension: Secondary | ICD-10-CM | POA: Diagnosis not present

## 2020-06-16 DIAGNOSIS — I25711 Atherosclerosis of autologous vein coronary artery bypass graft(s) with angina pectoris with documented spasm: Secondary | ICD-10-CM | POA: Diagnosis not present

## 2020-06-16 DIAGNOSIS — E782 Mixed hyperlipidemia: Secondary | ICD-10-CM | POA: Diagnosis not present

## 2020-06-26 ENCOUNTER — Other Ambulatory Visit: Payer: Self-pay

## 2020-06-26 ENCOUNTER — Encounter: Payer: Self-pay | Admitting: Family Medicine

## 2020-06-26 ENCOUNTER — Ambulatory Visit (INDEPENDENT_AMBULATORY_CARE_PROVIDER_SITE_OTHER): Payer: PPO | Admitting: Family Medicine

## 2020-06-26 VITALS — BP 122/79 | HR 55 | Ht 69.0 in | Wt 196.0 lb

## 2020-06-26 DIAGNOSIS — R39198 Other difficulties with micturition: Secondary | ICD-10-CM | POA: Diagnosis not present

## 2020-06-26 DIAGNOSIS — I25810 Atherosclerosis of coronary artery bypass graft(s) without angina pectoris: Secondary | ICD-10-CM | POA: Diagnosis not present

## 2020-06-26 DIAGNOSIS — J4520 Mild intermittent asthma, uncomplicated: Secondary | ICD-10-CM

## 2020-06-26 DIAGNOSIS — E782 Mixed hyperlipidemia: Secondary | ICD-10-CM | POA: Diagnosis not present

## 2020-06-26 DIAGNOSIS — I10 Essential (primary) hypertension: Secondary | ICD-10-CM

## 2020-06-26 DIAGNOSIS — Z0000 Encounter for general adult medical examination without abnormal findings: Secondary | ICD-10-CM

## 2020-06-26 MED ORDER — FLUTICASONE-SALMETEROL 100-50 MCG/ACT IN AEPB: 1.0000 | INHALATION_SPRAY | Freq: Two times a day (BID) | RESPIRATORY_TRACT | 6 refills | Status: AC

## 2020-06-26 NOTE — Progress Notes (Signed)
Medicare Initial Preventative Physical Exam    Patient: Joseph Cooper, Male    DOB: Nov 22, 1950, 70 y.o.   MRN: [id number] Visit Date: 06/26/2020  Today's Provider: [name], PA-C   Chief Complaint  Patient presents with  . Medicare Wellness   Subjective    Medicare Initial Preventative Physical Exam Joseph Cooper is a 70 y.o. male who presents today for his Initial Preventative Physical Exam.   Pt had an EKG at the Cardiologist last week.     Social History   Socioeconomic History  . Marital status: Married    Spouse name: Not on file  . Number of children: Not on file  . Years of education: Not on file  . Highest education level: Not on file  Occupational History  . Not on file  Tobacco Use  . Smoking status: Never Smoker  . Smokeless tobacco: Never Used  Vaping Use  . Vaping Use: Never used  Substance and Sexual Activity  . Alcohol use: Yes    Alcohol/week: 0.0 standard drinks    Comment: occasionally   . Drug use: No  . Sexual activity: Not on file  Other Topics Concern  . Not on file  Social History Narrative  . Not on file   Social Determinants of Health   Financial Resource Strain: Not on file  Food Insecurity: Not on file  Transportation Needs: Not on file  Physical Activity: Not on file  Stress: Not on file  Social Connections: Not on file  Intimate Partner Violence: Not on file    Past Medical History:  Diagnosis Date  . Actinic keratosis 01/30/2019   bx proven - ant vertex scalp   . Basal cell carcinoma 05/21/2008   L sup deltoid   . Basal cell carcinoma 05/21/2008   L post deltoid   . Basal cell carcinoma 07/09/2013   R shoulder/lat clavicle   . Basal cell carcinoma 06/01/2017   R mid med pretibial   . CAD (coronary artery disease)   . Hyperlipidemia   . Hypertension      Patient Active Problem List   Diagnosis Date Noted  . Hx of adenomatous polyp of colon 07/02/2016  . CKD (chronic kidney disease) stage 3, GFR  30-59 ml/min (HCC) 01/06/2016  . Benign neoplasm of colon 03/04/2015  . ED (erectile dysfunction) of organic origin 03/04/2015  . HLD (hyperlipidemia) 03/04/2015  . Asthma, mild intermittent 03/04/2015  . Chronic systolic heart failure ([place]) 12/07/2013  . Arteriosclerosis of autologous vein coronary artery bypass graft 12/07/2013  . Benign essential HTN 01/07/2009  . Asthma, exogenous 02/26/2008  . CAD in native artery 01/05/2008    Past Surgical History:  Procedure Laterality Date  . COLONOSCOPY WITH PROPOFOL N/A 11/03/2016   Procedure: COLONOSCOPY WITH PROPOFOL;  Surgeon: Manya Silvas, MD;  Location: [facility];  Service: Endoscopy;  Laterality: N/A;  . CORONARY ARTERY BYPASS GRAFT    . TONSILLECTOMY AND ADENOIDECTOMY    . VASECTOMY      His family history includes CAD in his father; Diabetes in his sister; Healthy in his brother, daughter, and daughter; Heart attack in his maternal grandfather, maternal grandmother, paternal grandfather, and sister; Heart disease in his father; Stroke in his father. There is no history of Colon cancer or Prostate cancer.   Current Outpatient Medications:  .  albuterol (VENTOLIN HFA) 108 (90 Base) MCG/ACT inhaler, INHALE 1-2 PUFFS BY MOUTH 4 TIMES DAILY AS NEEDED, Disp: 8.5 each, Rfl: 1 .  aspirin EC 81 MG tablet, Take 81 mg by mouth daily., Disp: , Rfl:  .  carvedilol (COREG) 6.25 MG tablet, Take 6.25 mg by mouth 2 (two) times daily., Disp: , Rfl:  .  hydrocortisone (ANUSOL-HC) 25 MG suppository, Place 1 suppository (25 mg total) rectally 2 (two) times daily., Disp: 12 suppository, Rfl: 0 .  lisinopril (PRINIVIL,ZESTRIL) 40 MG tablet, Take 40 mg by mouth. , Disp: , Rfl:  .  triamterene-hydrochlorothiazide (MAXZIDE-25) 37.5-25 MG tablet, 1 tablet daily, Disp: , Rfl:  .  valACYclovir (VALTREX) 1000 MG tablet, Take 2 tablets by mouth twice a day for one day when cold sore occurs., Disp: 20 tablet, Rfl: 0 .  rosuvastatin (CRESTOR) 10 MG tablet,  Take 1 tablet by mouth daily., Disp: , Rfl:    Patient Care Team: Iriel Nason, Vickki Muff, PA-C as PCP - General (Family Medicine)  Review of Systems  Constitutional: Negative.   HENT: Negative.   Respiratory: Negative.   Cardiovascular: Negative.   Gastrointestinal: Negative.   Endocrine: Negative.   Genitourinary: Negative.   Musculoskeletal: Negative.   Skin: Negative.   Allergic/Immunologic: Negative.   Neurological: Negative.   Hematological: Negative.   Psychiatric/Behavioral: Negative.     Objective    Vitals: BP 122/79 (BP Location: Right Arm, Patient Position: Sitting, Cuff Size: Large)   Pulse (!) 55   Ht 5\' 9"  (9.163 m)   Wt 196 lb (88.9 kg)   BMI 28.94 kg/m   Physical Exam Constitutional:      Appearance: He is well-developed.  HENT:     Head: Normocephalic and atraumatic.     Right Ear: External ear normal.     Left Ear: External ear normal.     Nose: Nose normal.  Eyes:     General:        Right eye: No discharge.     Conjunctiva/sclera: Conjunctivae normal.     Pupils: Pupils are equal, round, and reactive to light.  Neck:     Thyroid: No thyromegaly.     Trachea: No tracheal deviation.  Cardiovascular:     Rate and Rhythm: Normal rate and regular rhythm.     Heart sounds: Normal heart sounds. No murmur heard.   Pulmonary:     Effort: Pulmonary effort is normal. No respiratory distress.     Breath sounds: Normal breath sounds. No wheezing or rales.  Chest:     Chest wall: No tenderness.  Abdominal:     General: There is no distension.     Palpations: Abdomen is soft. There is no mass.     Tenderness: There is no abdominal tenderness. There is no guarding or rebound.  Genitourinary:    Penis: Normal.      Testes: Normal.     Prostate: Normal.     Rectum: Normal. Guaiac result negative.  Musculoskeletal:        General: No tenderness. Normal range of motion.     Cervical back: Normal range of motion and neck supple.  Lymphadenopathy:      Cervical: No cervical adenopathy.  Skin:    General: Skin is warm and dry.     Findings: No erythema or rash.  Neurological:     Mental Status: He is alert and oriented to person, place, and time.     Cranial Nerves: No cranial nerve deficit.     Motor: No abnormal muscle tone.     Coordination: Coordination normal.     Deep Tendon Reflexes: Reflexes are normal and  symmetric. Reflexes normal.  Psychiatric:        Behavior: Behavior normal.        Thought Content: Thought content normal.        Judgment: Judgment normal.      Activities of Daily Living No flowsheet data found.  Fall Risk Assessment Fall Risk  05/17/2019 03/17/2018 03/14/2017  Falls in the past year? 0 0 No  Injury with Fall? - 0 -     Depression Screen PHQ 2/9 Scores 05/17/2019 03/17/2018 03/14/2017 03/10/2015  PHQ - 2 Score 0 0 0 2  PHQ- 9 Score - 0 0 4    6CIT Screen 06/26/2020  What Year? 0 points  What month? 0 points  What time? 0 points  Count back from 20 0 points  Months in reverse 0 points  Repeat phrase 2 points  Total Score 2    No results found for any visits on 06/26/20.  Assessment & Plan      Initial Preventative Physical Exam  Reviewed patient's Family Medical History Reviewed and updated list of patient's medical providers Assessment of cognitive impairment was done Assessed patient's functional ability Established a written schedule for health screening [place] Completed and Reviewed  Exercise Activities and Dietary recommendations Goals   None     Immunization History  Administered Date(s) Administered  . Influenza Split 12/27/2011, 12/08/2012  . Influenza, High Dose Seasonal PF 11/06/2016, 03/17/2018  . Influenza,inj,Quad PF,6+ Mos 11/14/2013  . Moderna Sars-Covid-2 Vaccination 04/25/2019, 05/23/2019  . Pneumococcal Conjugate-13 03/14/2017  . Pneumococcal Polysaccharide-23 02/15/2012, 03/17/2018  . Tdap 02/15/2012  . Zoster 02/15/2012    Health  Maintenance  Topic Date Due  . COVID-19 Vaccine (3 - Moderna risk 4-dose series) 06/20/2019  . INFLUENZA VACCINE  09/01/2020  . TETANUS/TDAP  02/14/2022  . COLONOSCOPY (Pts 45-4yrs Insurance coverage will need to be confirmed)  11/16/2026  . Hepatitis C Screening  Completed  . PNA vac Low Risk Adult  Completed  . HPV VACCINES  Aged Out     Discussed health benefits of physical activity, and encouraged him to engage in regular exercise appropriate for his age and condition.   1. Annual physical exam General health good. 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 Counseled regarding health maintenance.  2. Coronary atherosclerosis of autologous vein bypass graft without angina Followed by Dr. Nehemiah Massed (cardiologist) annually. Last EKG was done in his office on 06-16-20 - CBC with Differential/Platelet - Comprehensive metabolic panel - Lipid panel - TSH  3. Benign essential HTN Very good control of BP with CKD stage 3 with hyperlipidemia. Tolerating Lisinopril 40 mg qd, Dyazide 25 mg qd and Coreg 6.25 mg BID without side effects. Recheck labs. - CBC with Differential/Platelet - Comprehensive metabolic panel - Lipid panel - TSH  4. Mixed hyperlipidemia Tolerating Rosuvastatin 20 mg qd and need recheck of labs.  - CBC with Differential/Platelet - Comprehensive metabolic panel - Lipid panel - TSH  5. Decreased urine stream Only get up 1-2 times a night for urinating. Decrease in stream with hesitancy. DRE unremarkable today. Will check labs. - Comprehensive metabolic panel - PSA  6. Mild intermittent asthma without complication Having to use Albuterol at least one a day. Will add Advair and save Albuterol for prn use only. - fluticasone-salmeterol (ADVAIR) 100-50 MCG/ACT AEPB; Inhale 1 puff into the lungs 2 (two) times daily.  Dispense: 60 each; Refill: 6   No follow-ups on file.  I, Chantz Montefusco, PA-C, have reviewed all documentation  for this visit. The documentation on 06/26/20 for the exam, diagnosis, procedures, and orders are all accurate and complete.    [name], PA-C  [employer] [phone] (phone) [phone] (fax)  [place]

## 2020-06-27 LAB — CBC WITH DIFFERENTIAL/PLATELET
Basophils Absolute: 0.1 x10E3/uL (ref 0.0–0.2)
Basos: 1 %
EOS (ABSOLUTE): 0.5 x10E3/uL — ABNORMAL HIGH (ref 0.0–0.4)
Eos: 7 %
Hematocrit: 44.4 % (ref 37.5–51.0)
Hemoglobin: 14.5 g/dL (ref 13.0–17.7)
Immature Grans (Abs): 0 x10E3/uL (ref 0.0–0.1)
Immature Granulocytes: 0 %
Lymphocytes Absolute: 1.5 x10E3/uL (ref 0.7–3.1)
Lymphs: 21 %
MCH: 31.3 pg (ref 26.6–33.0)
MCHC: 32.7 g/dL (ref 31.5–35.7)
MCV: 96 fL (ref 79–97)
Monocytes Absolute: 0.7 x10E3/uL (ref 0.1–0.9)
Monocytes: 9 %
Neutrophils Absolute: 4.3 x10E3/uL (ref 1.4–7.0)
Neutrophils: 62 %
Platelets: 169 x10E3/uL (ref 150–450)
RBC: 4.64 x10E6/uL (ref 4.14–5.80)
RDW: 12.6 % (ref 11.6–15.4)
WBC: 7.1 x10E3/uL (ref 3.4–10.8)

## 2020-06-27 LAB — COMPREHENSIVE METABOLIC PANEL
ALT: 24 IU/L (ref 0–44)
AST: 31 IU/L (ref 0–40)
Albumin/Globulin Ratio: 2.6 — ABNORMAL HIGH (ref 1.2–2.2)
Albumin: 4.7 g/dL (ref 3.8–4.8)
Alkaline Phosphatase: 92 IU/L (ref 44–121)
BUN/Creatinine Ratio: 12 (ref 10–24)
BUN: 17 mg/dL (ref 8–27)
Bilirubin Total: 2 mg/dL — ABNORMAL HIGH (ref 0.0–1.2)
CO2: 26 mmol/L (ref 20–29)
Calcium: 9.3 mg/dL (ref 8.6–10.2)
Chloride: 100 mmol/L (ref 96–106)
Creatinine, Ser: 1.47 mg/dL — ABNORMAL HIGH (ref 0.76–1.27)
Globulin, Total: 1.8 g/dL (ref 1.5–4.5)
Glucose: 90 mg/dL (ref 65–99)
Potassium: 4.4 mmol/L (ref 3.5–5.2)
Sodium: 140 mmol/L (ref 134–144)
Total Protein: 6.5 g/dL (ref 6.0–8.5)
eGFR: 52 mL/min/{1.73_m2} — ABNORMAL LOW (ref >59)

## 2020-06-27 LAB — LIPID PANEL
Chol/HDL Ratio: 2.8 ratio (ref 0.0–5.0)
Cholesterol, Total: 130 mg/dL (ref 100–199)
HDL: 46 mg/dL (ref >39)
LDL Chol Calc (NIH): 67 mg/dL (ref 0–99)
Triglycerides: 89 mg/dL (ref 0–149)
VLDL Cholesterol Cal: 17 mg/dL (ref 5–40)

## 2020-06-27 LAB — TSH: TSH: 1.47 u[IU]/mL (ref 0.450–4.500)

## 2020-06-27 LAB — PSA: Prostate Specific Ag, Serum: 0.4 ng/mL (ref 0.0–4.0)

## 2020-07-01 ENCOUNTER — Telehealth: Payer: Self-pay

## 2020-07-01 NOTE — Telephone Encounter (Signed)
Copied from [place] [id number]. Topic: General - Other >> Jul 01, 2020 12:20 PM Leward Quan A wrote: Reason for CRM: Patient called in asking to speak to Judson Roch say that she left a message regarding his labs. Can be reached at Ph# [phone]

## 2020-07-01 NOTE — Telephone Encounter (Signed)
See lab result note. Tried calling. Left message to call back.

## 2020-07-29 DIAGNOSIS — H5340 Unspecified visual field defects: Secondary | ICD-10-CM | POA: Diagnosis not present

## 2020-07-29 DIAGNOSIS — H02831 Dermatochalasis of right upper eyelid: Secondary | ICD-10-CM | POA: Diagnosis not present

## 2020-07-29 DIAGNOSIS — H02834 Dermatochalasis of left upper eyelid: Secondary | ICD-10-CM | POA: Diagnosis not present

## 2020-07-29 DIAGNOSIS — H02839 Dermatochalasis of unspecified eye, unspecified eyelid: Secondary | ICD-10-CM | POA: Diagnosis not present

## 2020-07-29 DIAGNOSIS — H04123 Dry eye syndrome of bilateral lacrimal glands: Secondary | ICD-10-CM | POA: Diagnosis not present

## 2020-10-28 ENCOUNTER — Other Ambulatory Visit: Payer: Self-pay

## 2020-10-28 ENCOUNTER — Ambulatory Visit: Payer: PPO | Admitting: Dermatology

## 2020-10-28 DIAGNOSIS — L578 Other skin changes due to chronic exposure to nonionizing radiation: Secondary | ICD-10-CM | POA: Diagnosis not present

## 2020-10-28 DIAGNOSIS — L570 Actinic keratosis: Secondary | ICD-10-CM | POA: Diagnosis not present

## 2020-10-28 DIAGNOSIS — L820 Inflamed seborrheic keratosis: Secondary | ICD-10-CM

## 2020-10-28 DIAGNOSIS — D485 Neoplasm of uncertain behavior of skin: Secondary | ICD-10-CM

## 2020-10-28 NOTE — Progress Notes (Signed)
Follow-Up Visit   Subjective  Joseph Cooper is a 70 y.o. male who presents for the following: Other (Spots of left arm and nose that need to be checked).  The following portions of the chart were reviewed this encounter and updated as appropriate:   Tobacco  Allergies  Meds  Problems  Med Hx  Surg Hx  Fam Hx     Review of Systems:  No other skin or systemic complaints except as noted in HPI or Assessment and Plan.  Objective  Well appearing patient in no apparent distress; mood and affect are within normal limits.  A focused examination was performed including face, left arm. Relevant physical exam findings are noted in the Assessment and Plan.  Left prox lat forearm 0.6 cm flesh colored papule  Right nasal tip Erythematous thin papules/macules with gritty scale.    Assessment & Plan  Neoplasm of uncertain behavior of skin Left prox lat forearm  Epidermal / dermal shaving  Lesion diameter (cm):  0.6 Informed consent: discussed and consent obtained   Timeout: patient name, date of birth, surgical site, and procedure verified   Procedure prep:  Patient was prepped and draped in usual sterile fashion Prep type:  Isopropyl alcohol Anesthesia: the lesion was anesthetized in a standard fashion   Anesthetic:  1% lidocaine w/ epinephrine 1-100,000 buffered w/ 8.4% NaHCO3 Instrument used: flexible razor blade   Hemostasis achieved with: pressure, aluminum chloride and electrodesiccation   Outcome: patient tolerated procedure well   Post-procedure details: sterile dressing applied and wound care instructions given   Dressing type: bandage and petrolatum    Specimen 1 - Surgical pathology Differential Diagnosis: Inflamed SK R/O Ca Check Margins: No  AK (actinic keratosis) Right nasal tip  Actinic Damage - Severe, confluent actinic changes with pre-cancerous actinic keratoses  - Severe, chronic, not at goal, secondary to cumulative UV radiation exposure over time -  diffuse scaly erythematous macules and papules with underlying dyspigmentation - Discussed Prescription "Field Treatment" for Severe, Chronic Confluent Actinic Changes with Pre-Cancerous Actinic Keratoses Field treatment involves treatment of an entire area of skin that has confluent Actinic Changes (Sun/ Ultraviolet light damage) and PreCancerous Actinic Keratoses by method of PhotoDynamic Therapy (PDT) and/or prescription Topical Chemotherapy agents such as 5-fluorouracil, 5-fluorouracil/calcipotriene, and/or imiquimod.  The purpose is to decrease the number of clinically evident and subclinical PreCancerous lesions to prevent progression to development of skin cancer by chemically destroying early precancer changes that may or may not be visible.  It has been shown to reduce the risk of developing skin cancer in the treated area. As a result of treatment, redness, scaling, crusting, and open sores may occur during treatment course. One or more than one of these methods may be used and may have to be used several times to control, suppress and eliminate the PreCancerous changes. Discussed treatment course, expected reaction, and possible side effects. - Recommend daily broad spectrum sunscreen SPF 30+ to sun-exposed areas, reapply every 2 hours as needed.  - Staying in the shade or wearing long sleeves, sun glasses (UVA+UVB protection) and wide brim hats (4-inch brim around the entire circumference of the hat) are also recommended. - Call for new or changing lesions.  - Start 5-fluorouracil/calcipotriene cream twice a day for 7 days to affected areas including nose. Prescription sent to [facility]. Patient advised they will receive an email to purchase the medication online and have it sent to their home. Patient provided with handout reviewing treatment course  and side effects and advised to call or message [place] on MyChart with any concerns.   Destruction of lesion - Right nasal  tip Complexity: simple   Destruction method: cryotherapy   Informed consent: discussed and consent obtained   Timeout:  patient name, date of birth, surgical site, and procedure verified Lesion destroyed using liquid nitrogen: Yes   Region frozen until ice ball extended beyond lesion: Yes   Outcome: patient tolerated procedure well with no complications   Post-procedure details: wound care instructions given    Return in about 4 months (around 02/27/2021) for AK follow up.  I, Ashok Cordia, CMA, am acting as scribe for Sarina Ser, MD . Documentation: I have reviewed the above documentation for accuracy and completeness, and I agree with the above.  Sarina Ser, MD

## 2020-10-28 NOTE — Patient Instructions (Addendum)
Instructions for [facility]  One or more of your medications was sent to the [facility] mail order [facility] pharmacy. You will receive an email from them and can purchase the medicine through that link. It will then be mailed to your home at the address you confirmed. If for any reason you do not receive an email from them, please check your spam folder. If you still do not find the email, please let us know. [facility] phone number is [phone].      Wound Care Instructions  Cleanse wound gently with soap and water once a day then pat dry with clean gauze. Apply a thing coat of Petrolatum (petroleum jelly, "Vaseline") over the wound (unless you have an allergy to this). We recommend that you use a new, sterile tube of Vaseline. Do not pick or remove scabs. Do not remove the yellow or white "healing tissue" from the base of the wound.  Cover the wound with fresh, clean, nonstick gauze and secure with paper tape. You may use Band-Aids in place of gauze and tape if the would is small enough, but would recommend trimming much of the tape off as there is often too much. Sometimes Band-Aids can irritate the skin.  You should call the office for your biopsy report after 1 week if you have not already been contacted.  If you experience any problems, such as abnormal amounts of bleeding, swelling, significant bruising, significant pain, or evidence of infection, please call the office immediately.  FOR ADULT SURGERY PATIENTS: If you need something for pain relief you may take 1 extra strength Tylenol (acetaminophen) AND 2 Ibuprofen (200mg  each) together every 4 hours as needed for pain. (do not take these if you are allergic to them or if you have a reason you should not take them.) Typically, you may only need pain medication for 1 to 3 days.    Cryotherapy Aftercare  Wash gently with soap and water everyday.   Apply Vaseline and Band-Aid daily until healed.    If  you have any questions or concerns for your doctor, please call our main line at [phone] and press option 4 to reach your doctor's medical assistant. If no one answers, please leave a voicemail as directed and we will return your call as soon as possible. Messages left after 4 pm will be answered the following business day.   You may also send [place] a message via [place]. We typically respond to MyChart messages within 1-2 business days.  For prescription refills, please ask your pharmacy to contact our office. Our fax number is [phone].  If you have an urgent issue when the clinic is closed that cannot wait until the next business day, you can page your doctor at the number below.    Please note that while we do our best to be available for urgent issues outside of office hours, we are not available 24/7.   If you have an urgent issue and are unable to reach [place], you may choose to seek medical care at your doctor's office, retail clinic, urgent care center, or emergency room.  If you have a medical emergency, please immediately call 911 or go to the emergency department.  Pager Numbers  - Dr. Nehemiah Massed: [phone]  - Dr. Laurence Ferrari: [phone]  - Dr. Nicole Kindred: [phone]  In the event of inclement weather, please call our main line at [phone] for an update on the status of any delays or closures.  Dermatology Medication Tips: Please  keep the boxes that topical medications come in in order to help keep track of the instructions about where and how to use these. Pharmacies typically print the medication instructions only on the boxes and not directly on the medication tubes.   If your medication is too expensive, please contact our office at [phone] option 4 or send [place] a message through [place].   We are unable to tell what your co-pay for medications will be in advance as this is different depending on your insurance coverage. However, we may be able to find a substitute  medication at lower cost or fill out paperwork to get insurance to cover a needed medication.   If a prior authorization is required to get your medication covered by your insurance company, please allow [place] 1-2 business days to complete this process.  Drug prices often vary depending on where the prescription is filled and some pharmacies may offer cheaper prices.  The website www.goodrx.com contains coupons for medications through different pharmacies. The prices here do not account for what the cost may be with help from insurance (it may be cheaper with your insurance), but the website can give you the price if you did not use any insurance.  - You can print the associated coupon and take it with your prescription to the pharmacy.  - You may also stop by our office during regular business hours and pick up a GoodRx coupon card.  - If you need your prescription sent electronically to a different pharmacy, notify our office through [facility] or by phone at [phone] option 4.

## 2020-10-30 ENCOUNTER — Encounter: Payer: Self-pay | Admitting: Dermatology

## 2020-10-31 ENCOUNTER — Telehealth: Payer: Self-pay

## 2020-10-31 NOTE — Telephone Encounter (Signed)
Patient informed of pathology results 

## 2020-10-31 NOTE — Telephone Encounter (Signed)
-----   Message from Ralene Bathe, MD sent at 10/30/2020  2:14 PM EDT ----- Diagnosis Skin , left prox lat forearm SEBORRHEIC KERATOSIS, INFLAMED  Benign keratosis No further treatment needed

## 2020-12-02 DIAGNOSIS — M9905 Segmental and somatic dysfunction of pelvic region: Secondary | ICD-10-CM | POA: Diagnosis not present

## 2020-12-02 DIAGNOSIS — M955 Acquired deformity of pelvis: Secondary | ICD-10-CM | POA: Diagnosis not present

## 2020-12-02 DIAGNOSIS — M9903 Segmental and somatic dysfunction of lumbar region: Secondary | ICD-10-CM | POA: Diagnosis not present

## 2020-12-02 DIAGNOSIS — M62830 Muscle spasm of back: Secondary | ICD-10-CM | POA: Diagnosis not present

## 2020-12-04 DIAGNOSIS — M9903 Segmental and somatic dysfunction of lumbar region: Secondary | ICD-10-CM | POA: Diagnosis not present

## 2020-12-04 DIAGNOSIS — M62830 Muscle spasm of back: Secondary | ICD-10-CM | POA: Diagnosis not present

## 2020-12-04 DIAGNOSIS — M9905 Segmental and somatic dysfunction of pelvic region: Secondary | ICD-10-CM | POA: Diagnosis not present

## 2020-12-04 DIAGNOSIS — M955 Acquired deformity of pelvis: Secondary | ICD-10-CM | POA: Diagnosis not present

## 2020-12-08 DIAGNOSIS — M9905 Segmental and somatic dysfunction of pelvic region: Secondary | ICD-10-CM | POA: Diagnosis not present

## 2020-12-08 DIAGNOSIS — M9903 Segmental and somatic dysfunction of lumbar region: Secondary | ICD-10-CM | POA: Diagnosis not present

## 2020-12-08 DIAGNOSIS — M955 Acquired deformity of pelvis: Secondary | ICD-10-CM | POA: Diagnosis not present

## 2020-12-08 DIAGNOSIS — M62830 Muscle spasm of back: Secondary | ICD-10-CM | POA: Diagnosis not present

## 2020-12-11 DIAGNOSIS — M62830 Muscle spasm of back: Secondary | ICD-10-CM | POA: Diagnosis not present

## 2020-12-11 DIAGNOSIS — M9905 Segmental and somatic dysfunction of pelvic region: Secondary | ICD-10-CM | POA: Diagnosis not present

## 2020-12-11 DIAGNOSIS — M9903 Segmental and somatic dysfunction of lumbar region: Secondary | ICD-10-CM | POA: Diagnosis not present

## 2020-12-11 DIAGNOSIS — M955 Acquired deformity of pelvis: Secondary | ICD-10-CM | POA: Diagnosis not present

## 2021-01-13 ENCOUNTER — Other Ambulatory Visit: Payer: Self-pay

## 2021-01-13 ENCOUNTER — Telehealth: Payer: Self-pay | Admitting: Family Medicine

## 2021-01-13 MED ORDER — ALBUTEROL SULFATE HFA 108 (90 BASE) MCG/ACT IN AERS: 2.0000 | INHALATION_SPRAY | RESPIRATORY_TRACT | 1 refills | Status: AC | PRN

## 2021-01-13 NOTE — Telephone Encounter (Signed)
[facility] faxed refill request for the following medications:  albuterol (VENTOLIN HFA) 108 (90 Base) MCG/ACT inhaler   Please advise.

## 2021-02-04 DIAGNOSIS — R059 Cough, unspecified: Secondary | ICD-10-CM | POA: Diagnosis not present

## 2021-02-07 DIAGNOSIS — Z85828 Personal history of other malignant neoplasm of skin: Secondary | ICD-10-CM | POA: Diagnosis not present

## 2021-02-07 DIAGNOSIS — Z833 Family history of diabetes mellitus: Secondary | ICD-10-CM

## 2021-02-07 DIAGNOSIS — Z823 Family history of stroke: Secondary | ICD-10-CM

## 2021-02-07 DIAGNOSIS — Z8249 Family history of ischemic heart disease and other diseases of the circulatory system: Secondary | ICD-10-CM | POA: Diagnosis not present

## 2021-02-07 DIAGNOSIS — J4520 Mild intermittent asthma, uncomplicated: Secondary | ICD-10-CM | POA: Diagnosis present

## 2021-02-07 DIAGNOSIS — I214 Non-ST elevation (NSTEMI) myocardial infarction: Principal | ICD-10-CM | POA: Diagnosis present

## 2021-02-07 DIAGNOSIS — I10 Essential (primary) hypertension: Secondary | ICD-10-CM | POA: Diagnosis not present

## 2021-02-07 DIAGNOSIS — Z951 Presence of aortocoronary bypass graft: Secondary | ICD-10-CM | POA: Diagnosis not present

## 2021-02-07 DIAGNOSIS — I5022 Chronic systolic (congestive) heart failure: Secondary | ICD-10-CM | POA: Diagnosis present

## 2021-02-07 DIAGNOSIS — Z20822 Contact with and (suspected) exposure to covid-19: Secondary | ICD-10-CM | POA: Diagnosis present

## 2021-02-07 DIAGNOSIS — I130 Hypertensive heart and chronic kidney disease with heart failure and stage 1 through stage 4 chronic kidney disease, or unspecified chronic kidney disease: Secondary | ICD-10-CM | POA: Diagnosis present

## 2021-02-07 DIAGNOSIS — Z79899 Other long term (current) drug therapy: Secondary | ICD-10-CM | POA: Diagnosis not present

## 2021-02-07 DIAGNOSIS — I2510 Atherosclerotic heart disease of native coronary artery without angina pectoris: Secondary | ICD-10-CM | POA: Diagnosis present

## 2021-02-07 DIAGNOSIS — I252 Old myocardial infarction: Secondary | ICD-10-CM | POA: Diagnosis not present

## 2021-02-07 DIAGNOSIS — I25810 Atherosclerosis of coronary artery bypass graft(s) without angina pectoris: Secondary | ICD-10-CM | POA: Diagnosis present

## 2021-02-07 DIAGNOSIS — E785 Hyperlipidemia, unspecified: Secondary | ICD-10-CM | POA: Diagnosis present

## 2021-02-07 DIAGNOSIS — I25790 Atherosclerosis of other coronary artery bypass graft(s) with unstable angina pectoris: Secondary | ICD-10-CM | POA: Diagnosis not present

## 2021-02-07 DIAGNOSIS — N1832 Chronic kidney disease, stage 3b: Secondary | ICD-10-CM | POA: Diagnosis present

## 2021-02-07 LAB — CBC WITH DIFFERENTIAL/PLATELET
Abs Immature Granulocytes: 0.07 10*3/uL (ref 0.00–0.07)
Basophils Absolute: 0 10*3/uL (ref 0.0–0.1)
Basophils Relative: 0 %
Eosinophils Absolute: 0 10*3/uL (ref 0.0–0.5)
Eosinophils Relative: 0 %
HCT: 36.7 % — ABNORMAL LOW (ref 39.0–52.0)
Hemoglobin: 12.9 g/dL — ABNORMAL LOW (ref 13.0–17.0)
Immature Granulocytes: 1 %
Lymphocytes Relative: 7 %
Lymphs Abs: 0.7 10*3/uL (ref 0.7–4.0)
MCH: 33.2 pg (ref 26.0–34.0)
MCHC: 35.1 g/dL (ref 30.0–36.0)
MCV: 94.6 fL (ref 80.0–100.0)
Monocytes Absolute: 0.6 10*3/uL (ref 0.1–1.0)
Monocytes Relative: 6 %
Neutro Abs: 8.7 10*3/uL — ABNORMAL HIGH (ref 1.7–7.7)
Neutrophils Relative %: 86 %
Platelets: 207 10*3/uL (ref 150–400)
RBC: 3.88 MIL/uL — ABNORMAL LOW (ref 4.22–5.81)
RDW: 12.5 % (ref 11.5–15.5)
WBC: 10.1 10*3/uL (ref 4.0–10.5)
nRBC: 0 % (ref 0.0–0.2)

## 2021-02-07 LAB — TROPONIN I (HIGH SENSITIVITY): Troponin I (High Sensitivity): 30 ng/L — ABNORMAL HIGH (ref <18)

## 2021-02-07 LAB — COMPREHENSIVE METABOLIC PANEL
ALT: 86 U/L — ABNORMAL HIGH (ref 0–44)
AST: 88 U/L — ABNORMAL HIGH (ref 15–41)
Albumin: 4 g/dL (ref 3.5–5.0)
Alkaline Phosphatase: 89 U/L (ref 38–126)
Anion gap: 8 (ref 5–15)
BUN: 31 mg/dL — ABNORMAL HIGH (ref 8–23)
CO2: 25 mmol/L (ref 22–32)
Calcium: 8.8 mg/dL — ABNORMAL LOW (ref 8.9–10.3)
Chloride: 105 mmol/L (ref 98–111)
Creatinine, Ser: 1.52 mg/dL — ABNORMAL HIGH (ref 0.61–1.24)
GFR, Estimated: 49 mL/min — ABNORMAL LOW (ref >60)
Glucose, Bld: 150 mg/dL — ABNORMAL HIGH (ref 70–99)
Potassium: 4.1 mmol/L (ref 3.5–5.1)
Sodium: 138 mmol/L (ref 135–145)
Total Bilirubin: 1.2 mg/dL (ref 0.3–1.2)
Total Protein: 6.5 g/dL (ref 6.5–8.1)

## 2021-02-07 NOTE — ED Notes (Signed)
Per ems pt with sudden onset of cp at 2200. Cabg history per ems. Vitals: 169/7-, 75, 100% ra, 18g lac, 324mg  asa given and 448mcg ntg spray. Ekg normal per ems.

## 2021-02-07 NOTE — ED Triage Notes (Signed)
Pt presents tonight after experiencing CP to the left side of his chest starting 1 hour ago while watching a movie. He states that the pain felt like "spams" without radiation to other areas of his body. Hx of MI & a Bypass. Denies dizziness, SOB, N/V.

## 2021-02-08 ENCOUNTER — Inpatient Hospital Stay
Admission: EM | Admit: 2021-02-08 | Discharge: 2021-02-09 | DRG: 281 | Disposition: A | Discharge status: Home / Self Care | Payer: PPO | Attending: Internal Medicine | Admitting: Internal Medicine

## 2021-02-08 DIAGNOSIS — Z833 Family history of diabetes mellitus: Secondary | ICD-10-CM | POA: Diagnosis not present

## 2021-02-08 DIAGNOSIS — J4520 Mild intermittent asthma, uncomplicated: Secondary | ICD-10-CM | POA: Diagnosis present

## 2021-02-08 DIAGNOSIS — N183 Chronic kidney disease, stage 3 unspecified: Secondary | ICD-10-CM | POA: Diagnosis present

## 2021-02-08 DIAGNOSIS — I214 Non-ST elevation (NSTEMI) myocardial infarction: Secondary | ICD-10-CM | POA: Diagnosis present

## 2021-02-08 DIAGNOSIS — E785 Hyperlipidemia, unspecified: Secondary | ICD-10-CM | POA: Diagnosis present

## 2021-02-08 DIAGNOSIS — Z85828 Personal history of other malignant neoplasm of skin: Secondary | ICD-10-CM | POA: Diagnosis not present

## 2021-02-08 DIAGNOSIS — I2510 Atherosclerotic heart disease of native coronary artery without angina pectoris: Secondary | ICD-10-CM | POA: Diagnosis present

## 2021-02-08 DIAGNOSIS — Z20822 Contact with and (suspected) exposure to covid-19: Secondary | ICD-10-CM | POA: Diagnosis present

## 2021-02-08 DIAGNOSIS — I5022 Chronic systolic (congestive) heart failure: Secondary | ICD-10-CM | POA: Diagnosis present

## 2021-02-08 DIAGNOSIS — Z823 Family history of stroke: Secondary | ICD-10-CM | POA: Diagnosis not present

## 2021-02-08 DIAGNOSIS — Z951 Presence of aortocoronary bypass graft: Secondary | ICD-10-CM | POA: Diagnosis not present

## 2021-02-08 DIAGNOSIS — I25810 Atherosclerosis of coronary artery bypass graft(s) without angina pectoris: Secondary | ICD-10-CM | POA: Diagnosis present

## 2021-02-08 DIAGNOSIS — I10 Essential (primary) hypertension: Secondary | ICD-10-CM | POA: Diagnosis present

## 2021-02-08 DIAGNOSIS — I252 Old myocardial infarction: Secondary | ICD-10-CM | POA: Diagnosis not present

## 2021-02-08 DIAGNOSIS — I130 Hypertensive heart and chronic kidney disease with heart failure and stage 1 through stage 4 chronic kidney disease, or unspecified chronic kidney disease: Secondary | ICD-10-CM | POA: Diagnosis present

## 2021-02-08 DIAGNOSIS — N1832 Chronic kidney disease, stage 3b: Secondary | ICD-10-CM | POA: Diagnosis present

## 2021-02-08 DIAGNOSIS — Z8249 Family history of ischemic heart disease and other diseases of the circulatory system: Secondary | ICD-10-CM | POA: Diagnosis not present

## 2021-02-08 DIAGNOSIS — Z79899 Other long term (current) drug therapy: Secondary | ICD-10-CM | POA: Diagnosis not present

## 2021-02-08 LAB — RESP PANEL BY RT-PCR (FLU A&B, COVID) ARPGX2
Influenza A by PCR: NEGATIVE
Influenza B by PCR: NEGATIVE
SARS Coronavirus 2 by RT PCR: NEGATIVE

## 2021-02-08 LAB — PROTIME-INR
INR: 1 (ref 0.8–1.2)
Prothrombin Time: 12.9 seconds (ref 11.4–15.2)

## 2021-02-08 LAB — CBC
HCT: 34.5 % — ABNORMAL LOW (ref 39.0–52.0)
Hemoglobin: 12.1 g/dL — ABNORMAL LOW (ref 13.0–17.0)
MCH: 32.7 pg (ref 26.0–34.0)
MCHC: 35.1 g/dL (ref 30.0–36.0)
MCV: 93.2 fL (ref 80.0–100.0)
Platelets: 166 10*3/uL (ref 150–400)
RBC: 3.7 MIL/uL — ABNORMAL LOW (ref 4.22–5.81)
RDW: 12.4 % (ref 11.5–15.5)
WBC: 9.7 10*3/uL (ref 4.0–10.5)
nRBC: 0 % (ref 0.0–0.2)

## 2021-02-08 LAB — CBG MONITORING, ED: Glucose-Capillary: 99 mg/dL (ref 70–99)

## 2021-02-08 LAB — HEPARIN LEVEL (UNFRACTIONATED)
Heparin Unfractionated: 0.44 IU/mL (ref 0.30–0.70)
Heparin Unfractionated: 0.5 IU/mL (ref 0.30–0.70)

## 2021-02-08 LAB — APTT: aPTT: 30 seconds (ref 24–36)

## 2021-02-08 LAB — HIV ANTIBODY (ROUTINE TESTING W REFLEX): HIV Screen 4th Generation wRfx: NONREACTIVE

## 2021-02-08 LAB — TROPONIN I (HIGH SENSITIVITY): Troponin I (High Sensitivity): 123 ng/L (ref <18)

## 2021-02-08 MED ORDER — ACETAMINOPHEN 325 MG PO TABS: 650.0000 mg | ORAL_TABLET | ORAL | Status: DC | PRN

## 2021-02-08 MED ORDER — NITROGLYCERIN 0.4 MG SL SUBL: 0.4000 mg | SUBLINGUAL_TABLET | SUBLINGUAL | Status: DC | PRN

## 2021-02-08 MED ORDER — ROSUVASTATIN CALCIUM 10 MG PO TABS
10.0000 mg | ORAL_TABLET | Freq: Every day | ORAL | Status: DC
  Administered 2021-02-08: 10 mg via ORAL
  Filled 2021-02-08: qty 1

## 2021-02-08 MED ORDER — ACETAMINOPHEN 325 MG PO TABS: 650.0000 mg | ORAL_TABLET | Freq: Four times a day (QID) | ORAL | Status: DC | PRN

## 2021-02-08 MED ORDER — HEPARIN BOLUS VIA INFUSION
4000.0000 [IU] | Freq: Once | INTRAVENOUS | Status: AC
  Administered 2021-02-08: 4000 [IU] via INTRAVENOUS
  Filled 2021-02-08: qty 4000

## 2021-02-08 MED ORDER — HEPARIN (PORCINE) 25000 UT/250ML-% IV SOLN
1100.0000 [IU]/h | INTRAVENOUS | Status: DC
  Administered 2021-02-08 (×2): 1100 [IU]/h via INTRAVENOUS
  Filled 2021-02-08 (×2): qty 250

## 2021-02-08 MED ORDER — ASPIRIN 81 MG PO CHEW
324.0000 mg | CHEWABLE_TABLET | ORAL | Status: AC
  Administered 2021-02-08: 324 mg via ORAL
  Filled 2021-02-08: qty 4

## 2021-02-08 MED ORDER — SODIUM CHLORIDE 0.9 % IV SOLN: INTRAVENOUS | Status: AC

## 2021-02-08 MED ORDER — SODIUM CHLORIDE 0.9% FLUSH
3.0000 mL | Freq: Two times a day (BID) | INTRAVENOUS | Status: DC
  Administered 2021-02-08 (×2): 3 mL via INTRAVENOUS

## 2021-02-08 MED ORDER — LISINOPRIL 10 MG PO TABS
40.0000 mg | ORAL_TABLET | Freq: Every day | ORAL | Status: DC
  Administered 2021-02-08: 40 mg via ORAL
  Filled 2021-02-08: qty 4

## 2021-02-08 MED ORDER — CARVEDILOL 6.25 MG PO TABS
6.2500 mg | ORAL_TABLET | Freq: Two times a day (BID) | ORAL | Status: DC
  Administered 2021-02-08 – 2021-02-09 (×4): 6.25 mg via ORAL
  Filled 2021-02-08 (×4): qty 1

## 2021-02-08 MED ORDER — ONDANSETRON HCL 4 MG/2ML IJ SOLN: 4.0000 mg | Freq: Four times a day (QID) | INTRAMUSCULAR | Status: DC | PRN

## 2021-02-08 MED ORDER — ALBUTEROL SULFATE (2.5 MG/3ML) 0.083% IN NEBU: 3.0000 mL | INHALATION_SOLUTION | RESPIRATORY_TRACT | Status: DC | PRN

## 2021-02-08 MED ORDER — ASPIRIN EC 81 MG PO TBEC
81.0000 mg | DELAYED_RELEASE_TABLET | Freq: Every day | ORAL | Status: DC
  Administered 2021-02-08: 81 mg via ORAL
  Filled 2021-02-08: qty 1

## 2021-02-08 MED ORDER — ASPIRIN 300 MG RE SUPP: 300.0000 mg | RECTAL | Status: AC

## 2021-02-08 NOTE — Consult Note (Signed)
ANTICOAGULATION CONSULT NOTE   Pharmacy Consult for heparin infusion Indication: chest pain/ACS  No Known Allergies  Patient Measurements: Height: 5\' 9"  (175.3 cm) Weight: 83.9 kg (185 lb) IBW/kg (Calculated) : 70.7 Heparin Dosing Weight: 83.9 kg   Vital Signs: BP: 117/79 (01/08 1100) Pulse Rate: 65 (01/08 1100)  Labs: Recent Labs    02/07/21 2316 02/08/21 0113 02/08/21 0343 02/08/21 0616 02/08/21 1053  HGB 12.9*  --   --  12.1*  --   HCT 36.7*  --   --  34.5*  --   PLT 207  --   --  166  --   APTT  --   --  30  --   --   LABPROT  --   --  12.9  --   --   INR  --   --  1.0  --   --   HEPARINUNFRC  --   --   --   --  0.44  CREATININE 1.52*  --   --   --   --   TROPONINIHS 30* 123*  --   --   --      Estimated Creatinine Clearance: 45.9 mL/min (A) (by C-G formula based on SCr of 1.52 mg/dL (H)).   Medical History: Past Medical History:  Diagnosis Date   Actinic keratosis 01/30/2019   bx proven - ant vertex scalp    Basal cell carcinoma 05/21/2008   L sup deltoid    Basal cell carcinoma 05/21/2008   L post deltoid    Basal cell carcinoma 07/09/2013   R shoulder/lat clavicle    Basal cell carcinoma 06/01/2017   R mid med pretibial    CAD (coronary artery disease)    Hyperlipidemia    Hypertension     Medications:  No prior anticoagulation noted   Assessment: 71 y.o male presented to ED 02/07/21 with chest pain. Patient with PMH of MI s/p CABG. Troponin trending up: 30 > 123  1/8 1053 HL 0.44  Goal of Therapy:  Heparin level 0.3-0.7 units/ml Monitor platelets by anticoagulation protocol: Yes   Plan:  Heparin level is therapeutic. Will continue heparin infusion at 1100 units/hr. Recheck heparin level in 6 hours. CBC daily while on heparin.   Oswald Hillock, PharmD, BCPS Clinical Pharmacist   02/08/2021,11:24 AM

## 2021-02-08 NOTE — Progress Notes (Signed)
Patient seen and examined.  Admitted early morning hours by nighttime hospitalist. 71 year old gentleman with history of coronary artery disease status post CABG times 06/2007, hypertension, hyperlipidemia, stage IIIa chronic kidney disease presented with sudden onset of left-sided chest pain.  Mildly elevated troponins.  Normal EKG.  Started on heparin infusion and admitted.  Currently chest pain-free.  Seen by cardiology.  Plan for ischemic evaluation with cardiac cath tomorrow.  We will keep NPO.  Remains on heparin infusion.  No charge visit.  Same-day admission.

## 2021-02-08 NOTE — H&P (Signed)
History and Physical    Joseph Cooper:[id number] DOB: [birth year] DOA: 02/08/2021  PCP: Rusty Aus, MD   Patient coming from: home  I have personally briefly reviewed patient's relevant medical records in [place]  Chief Complaint: chest pain  HPI: Joseph Cooper is a 71 y.o. male with medical history significant for CAD s/p CABGx5, HTN, HLD, CKD 3 who presents to the ED with sudden onset left-sided chest pain at 2200 on 02/07/2021 while looking at a movie.  It was nonradiating and of moderate to severe intensity.  He had no associated nausea, vomiting, diaphoresis, shortness of breath, lightheadedness or palpitations.  Patient has an ongoing cough for the past month but had a negative COVID, flu and RSV test on 1/4.  He denies fever or chills.  He denies leg pain or swelling.  Patient received aspirin and sublingual nitroglycerin with EMS  ED course: Vitals in the ED within normal limits Blood work: Troponin 38-123 Creatinine 1.52 which is baseline.  AST/ALT 88/86 WBC 10.1 and hemoglobin 12.9  EKG, personally viewed and interpreted: NSR at 71 with no acute ST-T wave changes  Imaging: CXR pending  Patient started on a heparin infusion.  Hospitalist consulted for admission.   Review of Systems: As per HPI otherwise all other systems on review of systems negative.   Assessment/Plan    NSTEMI (non-ST elevated myocardial infarction) ([place])   CAD s/p CABG x 5 -Continue heparin infusion from the ED - Aspirin, carvedilol and rosuvastatin - Nitroglycerin sublingual as needed chest pain with morphine for breakthrough - Cardiology consult - We will keep n.p.o. in case of procedure in the a.m.    Benign essential HTN - Continue lisinopril and carvedilol.  Will hold Maxide    Asthma, mild intermittent - Albuterol as needed    CKD (chronic kidney disease) stage 3, GFR 30-59 ml/min (HCC) - Renal function at baseline   DVT prophylaxis: Heparin infusion Code Status: full  code  Family Communication:  wife over phone Disposition Plan: Back to previous home environment Consults called: Cardiology Status:At the time of admission, it appears that the appropriate admission status for this patient is INPATIENT. This is judged to be reasonable and necessary in order to provide the required intensity of service to ensure the patient's safety given the presenting symptoms, physical exam findings, and initial radiographic and laboratory data in the context of their  Comorbid conditions.   Patient requires inpatient status due to high intensity of service, high risk for further deterioration and high frequency of surveillance required.   I certify that at the point of admission it is my clinical judgment that the patient will require inpatient hospital care spanning beyond 2 midnights   Physical Exam: Vitals:   02/07/21 2312 02/07/21 2316 02/08/21 0111  BP:  135/81 129/85  Pulse:  68 66  Resp:  18   Temp:  98.3 F (36.8 C)   TempSrc:  Oral   SpO2:  98%   Weight: 83.9 kg    Height: 5\' 9"  (1.753 m)     Constitutional: Alert, oriented x 3 . Not in any apparent distress HEENT:      Head: Normocephalic and atraumatic.         Eyes: PERLA, EOMI, Conjunctivae are normal. Sclera is non-icteric.       Mouth/Throat: Mucous membranes are moist.       Neck: Supple with no signs of meningismus. Cardiovascular: Regular rate and rhythm. No murmurs, gallops, or rubs.  2+ symmetrical distal pulses are present . No JVD. No  LE edema Respiratory: Respiratory effort normal .Lungs sounds clear bilaterally. No wheezes, crackles, or rhonchi.  Gastrointestinal: Soft, non tender, non distended. Positive bowel sounds.  Genitourinary: No CVA tenderness. Musculoskeletal: Nontender with normal range of motion in all extremities. No cyanosis, or erythema of extremities. Neurologic:  Face is symmetric. Moving all extremities. No gross focal neurologic deficits . Skin: Skin is warm, dry.  No  rash or ulcers Psychiatric: Mood and affect are appropriate     Past Medical History:  Diagnosis Date   Actinic keratosis 01/30/2019   bx proven - ant vertex scalp    Basal cell carcinoma 05/21/2008   L sup deltoid    Basal cell carcinoma 05/21/2008   L post deltoid    Basal cell carcinoma 07/09/2013   R shoulder/lat clavicle    Basal cell carcinoma 06/01/2017   R mid med pretibial    CAD (coronary artery disease)    Hyperlipidemia    Hypertension     Past Surgical History:  Procedure Laterality Date   COLONOSCOPY WITH PROPOFOL N/A 11/03/2016   Procedure: COLONOSCOPY WITH PROPOFOL;  Surgeon: Manya Silvas, MD;  Location: [facility] ENDOSCOPY;  Service: Endoscopy;  Laterality: N/A;   CORONARY ARTERY BYPASS GRAFT     TONSILLECTOMY AND ADENOIDECTOMY     VASECTOMY       reports that he has never smoked. He has never used smokeless tobacco. He reports current alcohol use. He reports that he does not use drugs.  No Known Allergies  Family History  Problem Relation Age of Onset   Stroke Father    CAD Father    Heart disease Father    Diabetes Sister    Heart attack Sister    Healthy Daughter    Heart attack Maternal Grandmother    Heart attack Maternal Grandfather    Heart attack Paternal Grandfather    Healthy Daughter    Healthy Brother    Colon cancer Neg Hx    Prostate cancer Neg Hx       Prior to Admission medications   Medication Sig Start Date End Date Taking? Authorizing Provider  albuterol (VENTOLIN HFA) 108 (90 Base) MCG/ACT inhaler Inhale 2 puffs into the lungs every 4 (four) hours as needed for wheezing or shortness of breath. 01/13/21   Birdie Sons, MD  aspirin EC 81 MG tablet Take 81 mg by mouth daily.    [provider]  carvedilol (COREG) 6.25 MG tablet Take 6.25 mg by mouth 2 (two) times daily. 02/24/17   [provider]  fluticasone-salmeterol (ADVAIR) 100-50 MCG/ACT AEPB Inhale 1 puff into the lungs 2 (two) times daily. 06/26/20    Chrismon, Vickki Muff, PA-C  hydrocortisone (ANUSOL-HC) 25 MG suppository Place 1 suppository (25 mg total) rectally 2 (two) times daily. 11/28/17   Chrismon, Vickki Muff, PA-C  lisinopril (PRINIVIL,ZESTRIL) 40 MG tablet Take 40 mg by mouth.  01/07/09   [provider]  rosuvastatin (CRESTOR) 10 MG tablet Take 1 tablet by mouth daily. 09/24/15 08/24/18  [provider]  triamterene-hydrochlorothiazide (MAXZIDE-25) 37.5-25 MG tablet 1 tablet daily 01/07/09   [provider]  valACYclovir (VALTREX) 1000 MG tablet Take 2 tablets by mouth twice a day for one day when cold sore occurs. 12/21/18   Chrismon, Vickki Muff, PA-C      Labs on Admission: I have personally reviewed following labs and imaging studies  CBC: Recent Labs  Lab 02/07/21 2316  WBC 10.1  NEUTROABS 8.7*  HGB 12.9*  HCT 36.7*  MCV 94.6  PLT 401   Basic Metabolic Panel: Recent Labs  Lab 02/07/21 2316  NA 138  K 4.1  CL 105  CO2 25  GLUCOSE 150*  BUN 31*  CREATININE 1.52*  CALCIUM 8.8*   GFR: Estimated Creatinine Clearance: 45.9 mL/min (A) (by C-G formula based on SCr of 1.52 mg/dL (H)). Liver Function Tests: Recent Labs  Lab 02/07/21 2316  AST 88*  ALT 86*  ALKPHOS 89  BILITOT 1.2  PROT 6.5  ALBUMIN 4.0   No results for input(s): LIPASE, AMYLASE in the last 168 hours. No results for input(s): AMMONIA in the last 168 hours. Coagulation Profile: No results for input(s): INR, PROTIME in the last 168 hours. Cardiac Enzymes: No results for input(s): CKTOTAL, CKMB, CKMBINDEX, TROPONINI in the last 168 hours. BNP (last 3 results) No results for input(s): PROBNP in the last 8760 hours. HbA1C: No results for input(s): HGBA1C in the last 72 hours. CBG: No results for input(s): GLUCAP in the last 168 hours. Lipid Profile: No results for input(s): CHOL, HDL, LDLCALC, TRIG, CHOLHDL, LDLDIRECT in the last 72 hours. Thyroid Function Tests: No results for input(s): TSH, T4TOTAL, FREET4, T3FREE,  THYROIDAB in the last 72 hours. Anemia Panel: No results for input(s): VITAMINB12, FOLATE, FERRITIN, TIBC, IRON, RETICCTPCT in the last 72 hours. Urine analysis:    Component Value Date/Time   BILIRUBINUR negative 03/11/2016 0942   PROTEINUR negative 03/11/2016 0942   UROBILINOGEN 0.2 03/11/2016 0942   NITRITE negative 03/11/2016 0942   LEUKOCYTESUR Negative 03/11/2016 0942    Radiological Exams on Admission: No results found.     Athena Masse MD [facility]   02/08/2021, 2:56 AM

## 2021-02-08 NOTE — Consult Note (Signed)
[facility] Cardiology  CARDIOLOGY CONSULT NOTE  Patient ID: Joseph Cooper MRN: [id number] DOB/AGE: [birth year] 71 y.o.  Admit date: 02/08/2021 Referring Physician Ghimire Primary Physician Sabra Heck Primary Cardiologist Nehemiah Massed Reason for Consultation unstable angina  HPI: 71 year old gentleman referred for unstable angina.  The patient was in his usual state of health till 02/07/2021, while watching TV, developed substernal chest discomfort of severe intensity which lasted approximately 15 to 30 minutes.  EMS was called, and treated with sublingual nitroglycerin upon arrival.  In [facility] ED, ECG revealed sinus rhythm at 67 bpm with nonspecific ST-T wave changes in leads V1 and II.  High-sensitivity troponin was mildly elevated (30, 123).  Patient denies any recurrent chest pain.  Patient has known history of coronary arteries, status post STEMI 02/12/2007, life flighted to [facility], cardiac catheterization revealing severe three-vessel coronary artery disease, with PTCA of RCA, and eventual CABG with LIMA to LAD, SVG to D1, OM1, PDA, and acute marginal branch on 02/15/2007.  Review of systems complete and found to be negative unless listed above     Past Medical History:  Diagnosis Date   Actinic keratosis 01/30/2019   bx proven - ant vertex scalp    Basal cell carcinoma 05/21/2008   L sup deltoid    Basal cell carcinoma 05/21/2008   L post deltoid    Basal cell carcinoma 07/09/2013   R shoulder/lat clavicle    Basal cell carcinoma 06/01/2017   R mid med pretibial    CAD (coronary artery disease)    Hyperlipidemia    Hypertension     Past Surgical History:  Procedure Laterality Date   COLONOSCOPY WITH PROPOFOL N/A 11/03/2016   Procedure: COLONOSCOPY WITH PROPOFOL;  Surgeon: Manya Silvas, MD;  Location: [facility] ENDOSCOPY;  Service: Endoscopy;  Laterality: N/A;   CORONARY ARTERY BYPASS GRAFT     TONSILLECTOMY AND ADENOIDECTOMY     VASECTOMY      (Not in a hospital admission)  Social History    Socioeconomic History   Marital status: Married    Spouse name: Not on file   Number of children: Not on file   Years of education: Not on file   Highest education level: Not on file  Occupational History   Not on file  Tobacco Use   Smoking status: Never   Smokeless tobacco: Never  Vaping Use   Vaping Use: Never used  Substance and Sexual Activity   Alcohol use: Yes    Alcohol/week: 0.0 standard drinks    Comment: occasionally    Drug use: No   Sexual activity: Not on file  Other Topics Concern   Not on file  Social History Narrative   Not on file   Social Determinants of Health   Financial Resource Strain: Not on file  Food Insecurity: Not on file  Transportation Needs: Not on file  Physical Activity: Not on file  Stress: Not on file  Social Connections: Not on file  Intimate Partner Violence: Not on file    Family History  Problem Relation Age of Onset   Stroke Father    CAD Father    Heart disease Father    Diabetes Sister    Heart attack Sister    Healthy Daughter    Heart attack Maternal Grandmother    Heart attack Maternal Grandfather    Heart attack Paternal Grandfather    Healthy Daughter    Healthy Brother    Colon cancer Neg Hx    Prostate cancer Neg Hx  Review of systems complete and found to be negative unless listed above      PHYSICAL EXAM  General: Well developed, well nourished, in no acute distress HEENT:  Normocephalic and atramatic Neck:  No JVD.  Lungs: Clear bilaterally to auscultation and percussion. Heart: HRRR . Normal S1 and S2 without gallops or murmurs.  Abdomen: Bowel sounds are positive, abdomen soft and non-tender  Msk:  Back normal, normal gait. Normal strength and tone for age. Extremities: No clubbing, cyanosis or edema.   Neuro: Alert and oriented X 3. Psych:  Good affect, responds appropriately  Labs:   Lab Results  Component Value Date   WBC 9.7 02/08/2021   HGB 12.1 (L) 02/08/2021   HCT 34.5 (L)  02/08/2021   MCV 93.2 02/08/2021   PLT 166 02/08/2021    Recent Labs  Lab 02/07/21 2316  NA 138  K 4.1  CL 105  CO2 25  BUN 31*  CREATININE 1.52*  CALCIUM 8.8*  PROT 6.5  BILITOT 1.2  ALKPHOS 89  ALT 86*  AST 88*  GLUCOSE 150*   Lab Results  Component Value Date   CKTOTAL 224 01/10/2014   CKMB 0.9 01/10/2014   TROPONINI <0.03 10/30/2015    Lab Results  Component Value Date   CHOL 130 06/26/2020   CHOL 127 05/17/2019   CHOL 127 12/22/2018   Lab Results  Component Value Date   HDL 46 06/26/2020   HDL 42 05/17/2019   HDL 41 12/22/2018   Lab Results  Component Value Date   LDLCALC 67 06/26/2020   LDLCALC 65 05/17/2019   LDLCALC 71 12/22/2018   Lab Results  Component Value Date   TRIG 89 06/26/2020   TRIG 111 05/17/2019   TRIG 72 12/22/2018   Lab Results  Component Value Date   CHOLHDL 2.8 06/26/2020   CHOLHDL 3.1 12/22/2018   CHOLHDL 3.3 03/11/2016   No results found for: LDLDIRECT    Radiology: No results found.  EKG: Sinus rhythm at 67 bpm  ASSESSMENT AND PLAN:   1.  Unstable angina versus NSTEMI, with long episode of chest pain, mildly elevated high-sensitivity troponin (30, 123), nondiagnostic ECG 2.  Coronary artery disease, with history of STEMI, PTCA of RCA, CABG x5 with LIMA to LAD, SVG to D1, OM1, PDA, and acute marginal 02/15/2007 3.  Essential hypertension 4.  Chronic kidney disease stage III, BUN and creatinine 31 and 1.52, GFR 49  Recommendations  1.  Agree with current therapy 2.  Continue heparin infusion 3.  Proceed with cardiac catheterization with selective coronary arteriography scheduled for 02/09/2021.  The risk, benefits and alternatives of cardiac catheterization and possible PCI were explained to the patient and informed written consent was obtained.  Signed: Isaias Cowman MD,PhD, [facility] 02/08/2021, 9:46 AM

## 2021-02-08 NOTE — ED Notes (Signed)
Critical troponin of 123 called from lab. Informed dr. Kerman Passey, no new verbal orders received.

## 2021-02-08 NOTE — Consult Note (Signed)
ANTICOAGULATION CONSULT NOTE   Pharmacy Consult for heparin infusion Indication: chest pain/ACS  No Known Allergies  Patient Measurements: Height: 5\' 9"  (175.3 cm) Weight: 83.9 kg (185 lb) IBW/kg (Calculated) : 70.7 Heparin Dosing Weight: 83.9 kg   Vital Signs: BP: 120/71 (01/08 1736) Pulse Rate: 65 (01/08 1736)  Labs: Recent Labs    02/07/21 2316 02/08/21 0113 02/08/21 0343 02/08/21 0616 02/08/21 1053 02/08/21 1747  HGB 12.9*  --   --  12.1*  --   --   HCT 36.7*  --   --  34.5*  --   --   PLT 207  --   --  166  --   --   APTT  --   --  30  --   --   --   LABPROT  --   --  12.9  --   --   --   INR  --   --  1.0  --   --   --   HEPARINUNFRC  --   --   --   --  0.44 0.50  CREATININE 1.52*  --   --   --   --   --   TROPONINIHS 30* 123*  --   --   --   --      Estimated Creatinine Clearance: 45.9 mL/min (A) (by C-G formula based on SCr of 1.52 mg/dL (H)).   Medical History: Past Medical History:  Diagnosis Date   Actinic keratosis 01/30/2019   bx proven - ant vertex scalp    Basal cell carcinoma 05/21/2008   L sup deltoid    Basal cell carcinoma 05/21/2008   L post deltoid    Basal cell carcinoma 07/09/2013   R shoulder/lat clavicle    Basal cell carcinoma 06/01/2017   R mid med pretibial    CAD (coronary artery disease)    Hyperlipidemia    Hypertension     Medications:  No prior anticoagulation noted   Assessment: 71 y.o male presented to ED 02/07/21 with chest pain. Patient with PMH of MI s/p CABG. Troponin trending up: 30 > 123. Pharmacy has been consulted for heparin dosing/monitoring.   1/8 1053 HL 0.44 1/8 1747 HL 0.50  Goal of Therapy:  Heparin level 0.3-0.7 units/ml Monitor platelets by anticoagulation protocol: Yes   Plan:  Heparin level is therapeutic x 2. Will continue heparin infusion at 1100 units/hr. Recheck heparin level with AM labs. CBC daily while on heparin.   Sherilyn Banker, PharmD, BCPS Clinical Pharmacist   02/08/2021,6:21  PM

## 2021-02-08 NOTE — ED Provider Notes (Signed)
[facility] Provider Note    Event Date/Time   First MD Initiated Contact with Patient 02/08/21 [phone]     (approximate)   History   Chest Pain   HPI  Joseph Cooper is a 71 y.o. male whose medical history is notable for severe CAD status post 5 vessel CABG about 14 years ago.  His cardiologist is Dr. Nehemiah Massed.  He says he has not had a catheterization since the CABG.  He presents tonight for acute onset severe central chest squeezing pain and pressure.  This occurred while watching a movie.  It continued for 15 to 20 minutes and then got better after full dose aspirin and nitroglycerin by EMS.  He is currently not having any pain.  He does not regularly have pain and this is new and concerning to him.  It was associated with shortness of breath at the time but the shortness of breath has resolved.  No recent fever.  No recent trauma.  No abdominal pain, nausea, nor vomiting.  No recent medication changes, however he has not taken his aspirin recently because he was told to hold it for an elective procedure to his eyelids next week.     Physical Exam   Triage Vital Signs: ED Triage Vitals  Enc Vitals Group     BP 02/07/21 2316 135/81     Pulse Rate 02/07/21 2316 68     Resp 02/07/21 2316 18     Temp 02/07/21 2316 98.3 F (36.8 C)     Temp Source 02/07/21 2316 Oral     SpO2 02/07/21 2316 98 %     Weight 02/07/21 2312 83.9 kg (185 lb)     Height 02/07/21 2312 1.753 m (5\' 9" )     Head Circumference --      Peak Flow --      Pain Score 02/07/21 2312 0     Pain Loc --      Pain Edu? --      Excl. in [place]? --     Most recent vital signs: Vitals:   02/08/21 0215 02/08/21 0230  BP: 129/73 129/84  Pulse: 66 70  Resp:    Temp:    SpO2: 97% 98%     General: Awake, no distress.  CV:  Good peripheral perfusion.  Resp:  Normal effort.  Abd:  No distention.  Other:  No gross focal neurological deficits.   ED Results / Procedures / Treatments    Labs (all labs ordered are listed, but only abnormal results are displayed) Labs Reviewed  CBC WITH DIFFERENTIAL/PLATELET - Abnormal; Notable for the following components:      Result Value   RBC 3.88 (*)    Hemoglobin 12.9 (*)    HCT 36.7 (*)    Neutro Abs 8.7 (*)    All other components within normal limits  COMPREHENSIVE METABOLIC PANEL - Abnormal; Notable for the following components:   Glucose, Bld 150 (*)    BUN 31 (*)    Creatinine, Ser 1.52 (*)    Calcium 8.8 (*)    AST 88 (*)    ALT 86 (*)    GFR, Estimated 49 (*)    All other components within normal limits  TROPONIN I (HIGH SENSITIVITY) - Abnormal; Notable for the following components:   Troponin I (High Sensitivity) 30 (*)    All other components within normal limits  TROPONIN I (HIGH SENSITIVITY) - Abnormal; Notable for the following components:  Troponin I (High Sensitivity) 123 (*)    All other components within normal limits  RESP PANEL BY RT-PCR (FLU A&B, COVID) ARPGX2  PROTIME-INR  APTT  CBC  HIV ANTIBODY (ROUTINE TESTING W REFLEX)  HEPARIN LEVEL (UNFRACTIONATED)     EKG  ED ECG REPORT I, Hinda Kehr, the attending physician, personally viewed and interpreted this ECG.  Date: 02/07/2021 EKG Time: 23:13 Rate: 71 Rhythm: normal sinus rhythm QRS Axis: normal Intervals: normal ST/T Wave abnormalities: Non-specific ST segment / T-wave changes, but no clear evidence of acute ischemia. Narrative Interpretation: no definitive evidence of acute ischemia; does not meet STEMI criteria.   PROCEDURES:  Critical Care performed: Yes, see critical care procedure note(s)  .Critical Care Performed by: Hinda Kehr, MD Authorized by: Hinda Kehr, MD   Critical care provider statement:    Critical care time (minutes):  30   Critical care time was exclusive of:  Separately billable procedures and treating other patients   Critical care was necessary to treat or prevent imminent or life-threatening  deterioration of the following conditions:  Circulatory failure   Critical care was time spent personally by me on the following activities:  Development of treatment plan with patient or surrogate, evaluation of patient's response to treatment, examination of patient, obtaining history from patient or surrogate, ordering and performing treatments and interventions, ordering and review of laboratory studies, ordering and review of radiographic studies, pulse oximetry, re-evaluation of patient's condition and review of old charts .1-3 Lead EKG Interpretation Performed by: Hinda Kehr, MD Authorized by: Hinda Kehr, MD     Interpretation: normal     ECG rate:  70   ECG rate assessment: normal     Rhythm: sinus rhythm     Ectopy: none     Conduction: normal     MEDICATIONS ORDERED IN ED: Medications  heparin bolus via infusion 4,000 Units (4,000 Units Intravenous Bolus from Bag 02/08/21 0354)    Followed by  heparin ADULT infusion 100 units/mL (25000 units/223mL) (1,100 Units/hr Intravenous New Bag/Given 02/08/21 0355)  aspirin EC tablet 81 mg (has no administration in time range)  carvedilol (COREG) tablet 6.25 mg (6.25 mg Oral Given 02/08/21 0345)  lisinopril (ZESTRIL) tablet 40 mg (has no administration in time range)  rosuvastatin (CRESTOR) tablet 10 mg (has no administration in time range)  albuterol (PROVENTIL) (2.5 MG/3ML) 0.083% nebulizer solution 3 mL (has no administration in time range)  nitroGLYCERIN (NITROSTAT) SL tablet 0.4 mg (has no administration in time range)  0.9 %  sodium chloride infusion ( Intravenous New Bag/Given 02/08/21 0359)  acetaminophen (TYLENOL) tablet 650 mg (has no administration in time range)  ondansetron (ZOFRAN) injection 4 mg (has no administration in time range)  aspirin chewable tablet 324 mg (324 mg Oral Given 02/08/21 0344)    Or  aspirin suppository 300 mg ( Rectal See Alternative 02/08/21 0344)     IMPRESSION / MDM / ASSESSMENT AND PLAN / ED COURSE   I reviewed the triage vital signs and the nursing notes.                              Differential diagnosis includes, but is not limited to, ACS, PE, musculoskeletal pain, pneumonia.  The patient is on the cardiac monitor to evaluate for evidence of arrhythmia and/or significant heart rate changes.  Patient has a substantial cardiac history and I verified in the medical record that he has seen Dr. Nehemiah Massed and  has a history as he described.  His EKG shows no obvious sign of ischemia but his symptoms are very concerning in the setting of severe coronary artery disease.    Vital signs are stable and within normal limits.  Initial work-up included the EKG which was nonischemic, high-sensitivity troponin, CBC, comprehensive metabolic panel.  His CBC is within normal limits and comprehensive metabolic panel shows  stable chronic kidney disease but with no acute changes.  However his initial high-sensitivity troponin was 30 which is slightly above the upper limit of normal, but his repeat troponin was 123. Given his comorbidities including known CAD, hyperlipidemia, chronic systolic heart failure, and the sudden onset of chest pain at rest, must assume this is an NSTEMI.  I am treating with heparin bolus plus infusion and will admit to the hospitalist for additional evaluation and treatment.  Patient and his family understand and agree.    Clinical Course as of 02/08/21 8882  Sun Feb 08, 2021  0256 I consulted Dr. Damita Dunnings with the hospitalist service for admission.  We discussed the case in detail and she agrees with the treatment and plan and will admit the patient for further evaluation and treatment. [CF]    Clinical Course User Index [CF] Hinda Kehr, MD     FINAL CLINICAL IMPRESSION(S) / ED DIAGNOSES   Final diagnoses:  NSTEMI (non-ST elevated myocardial infarction) [facility])     Rx / DC Orders   ED Discharge Orders     None        Note:  This document was prepared using Dragon  voice recognition software and may include unintentional dictation errors.   Hinda Kehr, MD 02/08/21 [phone]

## 2021-02-08 NOTE — ED Notes (Signed)
Report received from [place], [place]

## 2021-02-08 NOTE — ED Notes (Signed)
Patient is resting comfortably in bed. Ambulatory to the restroom independently Pt continues to deny pain at this time

## 2021-02-08 NOTE — ED Notes (Signed)
Resting. No acute concerns. No complaints of pain. A&Ox4. Skin p/w/d. RR even and non-labored. Sinus Rhythm. Normotensive. Heparin infusing at 1100 u/hr.

## 2021-02-08 NOTE — ED Notes (Signed)
Pt resting comfortably in bed. Denies pain at this.  Call light within reach, denies further needs at this time

## 2021-02-08 NOTE — Consult Note (Signed)
ANTICOAGULATION CONSULT NOTE - Initial Consult  Pharmacy Consult for heparin infusion Indication: chest pain/ACS  No Known Allergies  Patient Measurements: Height: 5\' 9"  (175.3 cm) Weight: 83.9 kg (185 lb) IBW/kg (Calculated) : 70.7 Heparin Dosing Weight: 83.9 kg   Vital Signs: Temp: 98.3 F (36.8 C) (01/07 2316) Temp Source: Oral (01/07 2316) BP: 129/85 (01/08 0111) Pulse Rate: 66 (01/08 0111)  Labs: Recent Labs    02/07/21 2316 02/08/21 0113  HGB 12.9*  --   HCT 36.7*  --   PLT 207  --   CREATININE 1.52*  --   TROPONINIHS 30* 123*    Estimated Creatinine Clearance: 45.9 mL/min (A) (by C-G formula based on SCr of 1.52 mg/dL (H)).   Medical History: Past Medical History:  Diagnosis Date   Actinic keratosis 01/30/2019   bx proven - ant vertex scalp    Basal cell carcinoma 05/21/2008   L sup deltoid    Basal cell carcinoma 05/21/2008   L post deltoid    Basal cell carcinoma 07/09/2013   R shoulder/lat clavicle    Basal cell carcinoma 06/01/2017   R mid med pretibial    CAD (coronary artery disease)    Hyperlipidemia    Hypertension     Medications:  No prior anticoagulation noted   Assessment: 71 y.o male presented to ED 02/07/21 with chest pain. Patient with PMH of MI s/p CABG. Troponin trending up: 30 > 123  Goal of Therapy:  Heparin level 0.3-0.7 units/ml Monitor platelets by anticoagulation protocol: Yes   Plan:  Give 4000 units bolus x 1 Start heparin infusion at 1100 units/hr Check anti-Xa level in 6 hours and daily while on heparin Continue to monitor H&H and platelets  Dorothe Pea, PharmD, BCPS Clinical Pharmacist   02/08/2021,2:38 AM

## 2021-02-09 ENCOUNTER — Encounter
Admission: EM | Disposition: A | Discharge status: Home / Self Care | Payer: Self-pay | Source: Home / Self Care | Attending: Internal Medicine

## 2021-02-09 DIAGNOSIS — I214 Non-ST elevation (NSTEMI) myocardial infarction: Secondary | ICD-10-CM | POA: Diagnosis not present

## 2021-02-09 HISTORY — PX: LEFT HEART CATH AND CORONARY ANGIOGRAPHY: CATH118249

## 2021-02-09 LAB — CBC
HCT: 34.7 % — ABNORMAL LOW (ref 39.0–52.0)
Hemoglobin: 12.3 g/dL — ABNORMAL LOW (ref 13.0–17.0)
MCH: 33.2 pg (ref 26.0–34.0)
MCHC: 35.4 g/dL (ref 30.0–36.0)
MCV: 93.5 fL (ref 80.0–100.0)
Platelets: 144 10*3/uL — ABNORMAL LOW (ref 150–400)
RBC: 3.71 MIL/uL — ABNORMAL LOW (ref 4.22–5.81)
RDW: 12.4 % (ref 11.5–15.5)
WBC: 7.7 10*3/uL (ref 4.0–10.5)
nRBC: 0 % (ref 0.0–0.2)

## 2021-02-09 LAB — HEPARIN LEVEL (UNFRACTIONATED): Heparin Unfractionated: 0.39 IU/mL (ref 0.30–0.70)

## 2021-02-09 SURGERY — LEFT HEART CATH AND CORONARY ANGIOGRAPHY
Anesthesia: Moderate Sedation

## 2021-02-09 MED ORDER — MIDAZOLAM HCL 2 MG/2ML IJ SOLN
INTRAMUSCULAR | Status: DC | PRN
  Administered 2021-02-09: 1 mg via INTRAVENOUS

## 2021-02-09 MED ORDER — ASPIRIN 81 MG PO CHEW
CHEWABLE_TABLET | ORAL | Status: AC
  Administered 2021-02-09: 81 mg via ORAL
  Filled 2021-02-09: qty 1

## 2021-02-09 MED ORDER — LIDOCAINE HCL (PF) 1 % IJ SOLN
INTRAMUSCULAR | Status: DC | PRN
  Administered 2021-02-09: 2 mL

## 2021-02-09 MED ORDER — VERAPAMIL HCL 2.5 MG/ML IV SOLN
INTRAVENOUS | Status: DC | PRN
  Administered 2021-02-09: 2.5 mg via INTRA_ARTERIAL

## 2021-02-09 MED ORDER — SODIUM CHLORIDE 0.9% FLUSH: 3.0000 mL | INTRAVENOUS | Status: DC | PRN

## 2021-02-09 MED ORDER — SODIUM CHLORIDE 0.9 % IV SOLN: 250.0000 mL | INTRAVENOUS | Status: DC | PRN

## 2021-02-09 MED ORDER — HEPARIN SODIUM (PORCINE) 1000 UNIT/ML IJ SOLN
INTRAMUSCULAR | Status: AC
  Filled 2021-02-09: qty 10

## 2021-02-09 MED ORDER — SODIUM CHLORIDE 0.9 % WEIGHT BASED INFUSION
3.0000 mL/kg/h | INTRAVENOUS | Status: AC
  Administered 2021-02-09: 3 mL/kg/h via INTRAVENOUS

## 2021-02-09 MED ORDER — ACETAMINOPHEN 325 MG PO TABS: 650.0000 mg | ORAL_TABLET | ORAL | Status: DC | PRN

## 2021-02-09 MED ORDER — VERAPAMIL HCL 2.5 MG/ML IV SOLN
INTRAVENOUS | Status: AC
  Filled 2021-02-09: qty 2

## 2021-02-09 MED ORDER — TICAGRELOR 90 MG PO TABS: 90.0000 mg | ORAL_TABLET | Freq: Two times a day (BID) | ORAL | 0 refills | Status: AC

## 2021-02-09 MED ORDER — SODIUM CHLORIDE 0.9 % WEIGHT BASED INFUSION: 1.0000 mL/kg/h | INTRAVENOUS | Status: DC

## 2021-02-09 MED ORDER — MIDAZOLAM HCL 2 MG/2ML IJ SOLN
INTRAMUSCULAR | Status: AC
  Filled 2021-02-09: qty 2

## 2021-02-09 MED ORDER — ONDANSETRON HCL 4 MG/2ML IJ SOLN: 4.0000 mg | Freq: Four times a day (QID) | INTRAMUSCULAR | Status: DC | PRN

## 2021-02-09 MED ORDER — ASPIRIN 81 MG PO CHEW: 81.0000 mg | CHEWABLE_TABLET | ORAL | Status: AC

## 2021-02-09 MED ORDER — FENTANYL CITRATE (PF) 100 MCG/2ML IJ SOLN
INTRAMUSCULAR | Status: DC | PRN
  Administered 2021-02-09: 50 ug via INTRAVENOUS

## 2021-02-09 MED ORDER — SODIUM CHLORIDE 0.9% FLUSH: 3.0000 mL | Freq: Two times a day (BID) | INTRAVENOUS | Status: DC

## 2021-02-09 MED ORDER — HYDRALAZINE HCL 20 MG/ML IJ SOLN: 10.0000 mg | INTRAMUSCULAR | Status: DC | PRN

## 2021-02-09 MED ORDER — SODIUM CHLORIDE 0.9 % WEIGHT BASED INFUSION
1.0000 mL/kg/h | INTRAVENOUS | Status: DC
  Administered 2021-02-09: 1 mL/kg/h via INTRAVENOUS

## 2021-02-09 MED ORDER — LABETALOL HCL 5 MG/ML IV SOLN: 10.0000 mg | INTRAVENOUS | Status: DC | PRN

## 2021-02-09 MED ORDER — HEPARIN (PORCINE) IN NACL 1000-0.9 UT/500ML-% IV SOLN
INTRAVENOUS | Status: DC | PRN
  Administered 2021-02-09 (×2): 500 mL

## 2021-02-09 MED ORDER — FENTANYL CITRATE (PF) 100 MCG/2ML IJ SOLN
INTRAMUSCULAR | Status: AC
  Filled 2021-02-09: qty 2

## 2021-02-09 MED ORDER — IOHEXOL 300 MG/ML  SOLN
INTRAMUSCULAR | Status: DC | PRN
  Administered 2021-02-09: 158 mL

## 2021-02-09 MED ORDER — HEPARIN SODIUM (PORCINE) 1000 UNIT/ML IJ SOLN
INTRAMUSCULAR | Status: DC | PRN
  Administered 2021-02-09: 4000 [IU] via INTRAVENOUS

## 2021-02-09 SURGICAL SUPPLY — 14 items
CATH EXPO 5FR FR4 (CATHETERS) ×1 IMPLANT
CATH INFINITI 5 FR IM (CATHETERS) ×1 IMPLANT
CATH INFINITI 5 FR LCB (CATHETERS) ×1 IMPLANT
CATH INFINITI 5FR MULTPACK ANG (CATHETERS) ×1 IMPLANT
DEVICE RAD TR BAND REGULAR (VASCULAR PRODUCTS) ×1 IMPLANT
DRAPE BRACHIAL (DRAPES) ×1 IMPLANT
GLIDESHEATH SLEND SS 6F .021 (SHEATH) ×1 IMPLANT
GUIDEWIRE INQWIRE 1.5J.035X260 (WIRE) IMPLANT
INQWIRE 1.5J .035X260CM (WIRE) ×2
PACK CARDIAC CATH (CUSTOM PROCEDURE TRAY) ×2 IMPLANT
PROTECTION STATION PRESSURIZED (MISCELLANEOUS) ×2
SET ATX SIMPLICITY (MISCELLANEOUS) ×1 IMPLANT
STATION PROTECTION PRESSURIZED (MISCELLANEOUS) IMPLANT
TUBING CIL FLEX 10 FLL-RA (TUBING) ×1 IMPLANT

## 2021-02-09 NOTE — Progress Notes (Signed)
PROGRESS NOTE    Joseph Cooper  NTI:[id number] DOB: 09-Dec-1950 DOA: 02/08/2021 PCP: Rusty Aus, MD    Brief Narrative:  71 year old with history of CABG x5 in 2009 with substernal chest discomfort of severe intensity improved with nitroglycerin.  Mild elevated troponins 30-123.  Started on heparin drip and admitted to hospital with plan for cardiac catheterization.   Assessment & Plan:   Non-ST elevation MI: Patient on aspirin, carvedilol, lisinopril, Crestor and heparin infusion.  Currently chest pain-free.  Continue monitoring. Scheduled for cardiac cath today.  Further management as per cardiac cath findings.  Essential hypertension blood pressures stable on lisinopril and carvedilol.  CKD stage IIIb: Creatinine at about baseline.  Will monitor.  Procedure.  DVT prophylaxis: Heparin infusion   Code Status: Full code Family Communication: Wife at the bedside Disposition Plan: Status is: Inpatient  Remains inpatient appropriate because: Inpatient cardiac procedures planned         Consultants:  Cardiology  Procedures:  Cardiac cath planned  Antimicrobials:  None   Subjective: Patient seen and examined.  No chest pain since admission.  Denies any complaints.  Looking forward for cardiac cath and going home.  Objective: Vitals:   02/09/21 0600 02/09/21 0940 02/09/21 1013 02/09/21 1053  BP: (!) 86/56 (!) 86/71 127/79   Pulse: (!) 52 (!) 54 (!) 59   Resp: 15 16 20    Temp:   98.1 F (36.7 C)   TempSrc:   Oral   SpO2: 97% 98%  98%  Weight:      Height:       No intake or output data in the 24 hours ending 02/09/21 1241 Filed Weights   02/07/21 2312  Weight: 83.9 kg    Examination:  General exam: Appears calm and comfortable  Respiratory system: Clear to auscultation. Respiratory effort normal. Cardiovascular system: S1 & S2 heard, RRR. No JVD, murmurs, rubs, gallops or clicks. No pedal edema. Gastrointestinal system: Abdomen is nondistended,  soft and nontender. No organomegaly or masses felt. Normal bowel sounds heard. Central nervous system: Alert and oriented. No focal neurological deficits. Extremities: Symmetric 5 x 5 power. Skin: No rashes, lesions or ulcers Psychiatry: Judgement and insight appear normal. Mood & affect appropriate.     Data Reviewed: I have personally reviewed following labs and imaging studies  CBC: Recent Labs  Lab 02/07/21 2316 02/08/21 0616 02/09/21 0620  WBC 10.1 9.7 7.7  NEUTROABS 8.7*  --   --   HGB 12.9* 12.1* 12.3*  HCT 36.7* 34.5* 34.7*  MCV 94.6 93.2 93.5  PLT 207 166 867*   Basic Metabolic Panel: Recent Labs  Lab 02/07/21 2316  NA 138  K 4.1  CL 105  CO2 25  GLUCOSE 150*  BUN 31*  CREATININE 1.52*  CALCIUM 8.8*   GFR: Estimated Creatinine Clearance: 45.9 mL/min (A) (by C-G formula based on SCr of 1.52 mg/dL (H)). Liver Function Tests: Recent Labs  Lab 02/07/21 2316  AST 88*  ALT 86*  ALKPHOS 89  BILITOT 1.2  PROT 6.5  ALBUMIN 4.0   No results for input(s): LIPASE, AMYLASE in the last 168 hours. No results for input(s): AMMONIA in the last 168 hours. Coagulation Profile: Recent Labs  Lab 02/08/21 0343  INR 1.0   Cardiac Enzymes: No results for input(s): CKTOTAL, CKMB, CKMBINDEX, TROPONINI in the last 168 hours. BNP (last 3 results) No results for input(s): PROBNP in the last 8760 hours. HbA1C: No results for input(s): HGBA1C in the last 72 hours.  CBG: Recent Labs  Lab 02/08/21 1639  GLUCAP 99   Lipid Profile: No results for input(s): CHOL, HDL, LDLCALC, TRIG, CHOLHDL, LDLDIRECT in the last 72 hours. Thyroid Function Tests: No results for input(s): TSH, T4TOTAL, FREET4, T3FREE, THYROIDAB in the last 72 hours. Anemia Panel: No results for input(s): VITAMINB12, FOLATE, FERRITIN, TIBC, IRON, RETICCTPCT in the last 72 hours. Sepsis Labs: No results for input(s): PROCALCITON, LATICACIDVEN in the last 168 hours.  Recent Results (from the past 240  hour(s))  Resp Panel by RT-PCR (Flu A&B, Covid) Nasopharyngeal Swab     Status: None   Collection Time: 02/08/21  3:43 AM   Specimen: Nasopharyngeal Swab; Nasopharyngeal(NP) swabs in vial transport medium  Result Value Ref Range Status   SARS Coronavirus 2 by RT PCR NEGATIVE NEGATIVE Final    Comment: (NOTE) SARS-CoV-2 target nucleic acids are NOT DETECTED.  The SARS-CoV-2 RNA is generally detectable in upper respiratory specimens during the acute phase of infection. The lowest concentration of SARS-CoV-2 viral copies this assay can detect is 138 copies/mL. A negative result does not preclude SARS-Cov-2 infection and should not be used as the sole basis for treatment or other patient management decisions. A negative result may occur with  improper specimen collection/handling, submission of specimen other than nasopharyngeal swab, presence of viral mutation(s) within the areas targeted by this assay, and inadequate number of viral copies(<138 copies/mL). A negative result must be combined with clinical observations, patient history, and epidemiological information. The expected result is Negative.  Fact Sheet for Patients:  EntrepreneurPulse.com.au  Fact Sheet for Healthcare Providers:  IncredibleEmployment.be  This test is no t yet approved or cleared by the [place] FDA and  has been authorized for detection and/or diagnosis of SARS-CoV-2 by FDA under an Emergency Use Authorization (EUA). This EUA will remain  in effect (meaning this test can be used) for the duration of the COVID-19 declaration under Section 564(b)(1) of the Act, 21 U.S.C.section 360bbb-3(b)(1), unless the authorization is terminated  or revoked sooner.       Influenza A by PCR NEGATIVE NEGATIVE Final   Influenza B by PCR NEGATIVE NEGATIVE Final    Comment: (NOTE) The Xpert Xpress SARS-CoV-2/FLU/RSV plus assay is intended as an aid in the diagnosis of influenza from  Nasopharyngeal swab specimens and should not be used as a sole basis for treatment. Nasal washings and aspirates are unacceptable for Xpert Xpress SARS-CoV-2/FLU/RSV testing.  Fact Sheet for Patients: EntrepreneurPulse.com.au  Fact Sheet for Healthcare Providers: IncredibleEmployment.be  This test is not yet approved or cleared by the [place] FDA and has been authorized for detection and/or diagnosis of SARS-CoV-2 by FDA under an Emergency Use Authorization (EUA). This EUA will remain in effect (meaning this test can be used) for the duration of the COVID-19 declaration under Section 564(b)(1) of the Act, 21 U.S.C. section 360bbb-3(b)(1), unless the authorization is terminated or revoked.  Performed at [facility], [address] [place], [place] [address]          Radiology Studies: No results found.      Scheduled Meds:  [MAR Hold] aspirin EC  81 mg Oral Daily   [MAR Hold] carvedilol  6.25 mg Oral BID   [MAR Hold] lisinopril  40 mg Oral Daily   [MAR Hold] rosuvastatin  10 mg Oral Daily   [MAR Hold] sodium chloride flush  3 mL Intravenous Q12H   Continuous Infusions:  sodium chloride     sodium chloride     heparin  Stopped (02/09/21 0959)     LOS: 1 day    Time spent: 35 minutes    Barb Merino, MD [facility] Pager [phone]

## 2021-02-09 NOTE — ED Notes (Signed)
Lab at the bedside 

## 2021-02-09 NOTE — Progress Notes (Signed)
°  Transition of Care [facility]) Screening Note   Patient Details  Name: Joseph Cooper Date of Birth: June 01, 1950   Transition of Care [facility]) CM/SW Contact:    Alberteen Sam, LCSW Phone Number: 02/09/2021, 2:13 PM    Transition of Care Department [facility]) has reviewed patient and no TOC needs have been identified at this time. We will continue to monitor patient advancement through interdisciplinary progression rounds. If new patient transition needs arise, please place a TOC consult.  [place], [place]

## 2021-02-09 NOTE — Discharge Summary (Signed)
Physician Discharge Summary  BURNETT SPRAY NFA:[id number] DOB: 1950-11-03 DOA: 02/08/2021  PCP: Rusty Aus, MD  Admit date: 02/08/2021 Discharge date: 02/09/2021  Admitted From: home  Disposition:  home   Recommendations for Outpatient Follow-up:  Follow up with PCP in 1-2 weeks Follow up with cardiology 2 weeks [place] cardiology will schedule follow up   Home Health:NA  Equipment/Devices:NA   Discharge Condition:stable   CODE STATUS: Full Code Diet recommendation: low salt diet   Discharge Summary: 71 year old with history of CABG x5 in 2009 with substernal chest discomfort of severe intensity improved with nitroglycerin.  Mild elevated troponins 30-123.  Started on heparin drip and admitted to hospital with plan for cardiac catheterization.  Non-ST elevation MI: Patient on aspirin, carvedilol, lisinopril, Crestor and heparin infusion.  Currently chest pain-free.   Continue monitoring. Patient was taken to cardiac cath and found to have severe three-vessel coronary artery disease 60% stenosis left main, occluded LIMA to LAD, occluded SVG to OM1/D1, occluded SVG to PDA and acute marginal. Currently chest pain-free and stable. As per cardiology recommendation, able to go home.  Added Brilinta 90 mg twice daily along with his aspirin. Cardiology follow-up as outpatient, possible advanced percutaneous revascularization at [facility] as outpatient.   Essential hypertension blood pressures stable on lisinopril and carvedilol.  Resume on discharge.   CKD stage IIIb: Creatinine at about baseline.   Patient was seen in the morning rounds.  I was called by cardiology that patient is stable for discharge.  Will be going home today after post-cath monitoring.     Discharge Diagnoses:  Principal Problem:   NSTEMI (non-ST elevated myocardial infarction) ([place]) Active Problems:   CAD in native artery   Benign essential HTN   Asthma, mild intermittent   Chronic systolic heart failure (HCC)    CKD (chronic kidney disease) stage 3, GFR 30-59 ml/min [facility])    Discharge Instructions  Discharge Instructions     Call MD for:   Complete by: As directed    Any recurrent chest pain   Call MD for:  difficulty breathing, headache or visual disturbances   Complete by: As directed    Call MD for:  persistant dizziness or light-headedness   Complete by: As directed    Diet - low sodium heart healthy   Complete by: As directed    Increase activity slowly   Complete by: As directed       Allergies as of 02/09/2021   No Known Allergies      Medication List     TAKE these medications    albuterol 108 (90 Base) MCG/ACT inhaler Commonly known as: VENTOLIN HFA Inhale 2 puffs into the lungs every 4 (four) hours as needed for wheezing or shortness of breath.   aspirin EC 81 MG tablet Take 81 mg by mouth daily.   carvedilol 6.25 MG tablet Commonly known as: COREG Take 6.25 mg by mouth 2 (two) times daily.   cimetidine 200 MG tablet Commonly known as: TAGAMET Take 200 mg by mouth 2 (two) times daily as needed (indigestion).   fluticasone-salmeterol 100-50 MCG/ACT Aepb Commonly known as: ADVAIR Inhale 1 puff into the lungs 2 (two) times daily.   lisinopril 40 MG tablet Commonly known as: ZESTRIL Take 40 mg by mouth.   rosuvastatin 10 MG tablet Commonly known as: CRESTOR Take 10 mg by mouth daily.   ticagrelor 90 MG Tabs tablet Commonly known as: BRILINTA Take 1 tablet (90 mg total) by mouth 2 (two) times daily.  triamterene-hydrochlorothiazide 37.5-25 MG tablet Commonly known as: MAXZIDE-25 Take 1 tablet by mouth daily.        No Known Allergies  Consultations: Cardiology   Procedures/Studies: CARDIAC CATHETERIZATION  Result Date: 02/09/2021   Origin lesion is 100% stenosed.   Origin lesion is 100% stenosed.   Origin to Prox Graft lesion is 100% stenosed.   Prox Cx lesion is 50% stenosed.   Mid LM to Dist LM lesion is 60% stenosed.   Prox LAD to Mid LAD  lesion is 100% stenosed.   1st Diag lesion is 90% stenosed.   Prox LAD lesion is 75% stenosed.   Prox RCA lesion is 60% stenosed.   Dist RCA-1 lesion is 75% stenosed.   Dist RCA-2 lesion is 50% stenosed.   There is mild to moderate left ventricular systolic dysfunction. 1.  Severe three-vessel coronary artery disease with 60% stenosis distal left main with poststenotic ectasia, 100% stenosis proximal/mid LAD, 90% stenosis ostial D1, 50% stenosis proximal left circumflex, 75% stenosis distal RCA with significant ectasia throughout the vessel 2.  Occluded LIMA to LAD, occluded SVG to OM1/D1, occluded SVG to PDA/acute marginal 3.  Mildly reduced left ventricular function with apical dyskinesis Recommendations 1.  Medical therapy 2.  Aggressive risk factor modification 3.  Will discuss with [facility] about possible technically challenging percutaneous revascularization   (Echo, Carotid, EGD, Colonoscopy, ERCP)    Subjective: Patient seen and examined in the morning rounds.  Wife was at the bedside.  Patient denied any chest pain for the last 48 hours.  He had mild pain on arrival but remains symptom-free since then.  After cardiac cath, I was informed by cardiology that patient is cleared for discharge and they will schedule follow-up.   Discharge Exam: Vitals:   02/09/21 1013 02/09/21 1053  BP: 127/79   Pulse: (!) 59   Resp: 20   Temp: 98.1 F (36.7 C)   SpO2:  98%   Vitals:   02/09/21 0600 02/09/21 0940 02/09/21 1013 02/09/21 1053  BP: (!) 86/56 (!) 86/71 127/79   Pulse: (!) 52 (!) 54 (!) 59   Resp: 15 16 20    Temp:   98.1 F (36.7 C)   TempSrc:   Oral   SpO2: 97% 98%  98%  Weight:      Height:       Seen in the morning rounds as below. General: Pt is alert, awake, not in acute distress Cardiovascular: RRR, S1/S2 +, no rubs, no gallops Respiratory: CTA bilaterally, no wheezing, no rhonchi Abdominal: Soft, NT, ND, bowel sounds + Extremities: no edema, no cyanosis    The results of  significant diagnostics from this hospitalization (including imaging, microbiology, ancillary and laboratory) are listed below for reference.     Microbiology: Recent Results (from the past 240 hour(s))  Resp Panel by RT-PCR (Flu A&B, Covid) Nasopharyngeal Swab     Status: None   Collection Time: 02/08/21  3:43 AM   Specimen: Nasopharyngeal Swab; Nasopharyngeal(NP) swabs in vial transport medium  Result Value Ref Range Status   SARS Coronavirus 2 by RT PCR NEGATIVE NEGATIVE Final    Comment: (NOTE) SARS-CoV-2 target nucleic acids are NOT DETECTED.  The SARS-CoV-2 RNA is generally detectable in upper respiratory specimens during the acute phase of infection. The lowest concentration of SARS-CoV-2 viral copies this assay can detect is 138 copies/mL. A negative result does not preclude SARS-Cov-2 infection and should not be used as the sole basis for treatment or other patient management decisions.  A negative result may occur with  improper specimen collection/handling, submission of specimen other than nasopharyngeal swab, presence of viral mutation(s) within the areas targeted by this assay, and inadequate number of viral copies(<138 copies/mL). A negative result must be combined with clinical observations, patient history, and epidemiological information. The expected result is Negative.  Fact Sheet for Patients:  EntrepreneurPulse.com.au  Fact Sheet for Healthcare Providers:  IncredibleEmployment.be  This test is no t yet approved or cleared by the [place] FDA and  has been authorized for detection and/or diagnosis of SARS-CoV-2 by FDA under an Emergency Use Authorization (EUA). This EUA will remain  in effect (meaning this test can be used) for the duration of the COVID-19 declaration under Section 564(b)(1) of the Act, 21 U.S.C.section 360bbb-3(b)(1), unless the authorization is terminated  or revoked sooner.       Influenza A by  PCR NEGATIVE NEGATIVE Final   Influenza B by PCR NEGATIVE NEGATIVE Final    Comment: (NOTE) The Xpert Xpress SARS-CoV-2/FLU/RSV plus assay is intended as an aid in the diagnosis of influenza from Nasopharyngeal swab specimens and should not be used as a sole basis for treatment. Nasal washings and aspirates are unacceptable for Xpert Xpress SARS-CoV-2/FLU/RSV testing.  Fact Sheet for Patients: EntrepreneurPulse.com.au  Fact Sheet for Healthcare Providers: IncredibleEmployment.be  This test is not yet approved or cleared by the [place] FDA and has been authorized for detection and/or diagnosis of SARS-CoV-2 by FDA under an Emergency Use Authorization (EUA). This EUA will remain in effect (meaning this test can be used) for the duration of the COVID-19 declaration under Section 564(b)(1) of the Act, 21 U.S.C. section 360bbb-3(b)(1), unless the authorization is terminated or revoked.  Performed at [facility], [address] [place], [place] [address]      Labs: BNP (last 3 results) No results for input(s): BNP in the last 8760 hours. Basic Metabolic Panel: Recent Labs  Lab 02/07/21 2316  NA 138  K 4.1  CL 105  CO2 25  GLUCOSE 150*  BUN 31*  CREATININE 1.52*  CALCIUM 8.8*   Liver Function Tests: Recent Labs  Lab 02/07/21 2316  AST 88*  ALT 86*  ALKPHOS 89  BILITOT 1.2  PROT 6.5  ALBUMIN 4.0   No results for input(s): LIPASE, AMYLASE in the last 168 hours. No results for input(s): AMMONIA in the last 168 hours. CBC: Recent Labs  Lab 02/07/21 2316 02/08/21 0616 02/09/21 0620  WBC 10.1 9.7 7.7  NEUTROABS 8.7*  --   --   HGB 12.9* 12.1* 12.3*  HCT 36.7* 34.5* 34.7*  MCV 94.6 93.2 93.5  PLT 207 166 144*   Cardiac Enzymes: No results for input(s): CKTOTAL, CKMB, CKMBINDEX, TROPONINI in the last 168 hours. BNP: Invalid input(s): POCBNP CBG: Recent Labs  Lab 02/08/21 1639  GLUCAP 99   D-Dimer No  results for input(s): DDIMER in the last 72 hours. Hgb A1c No results for input(s): HGBA1C in the last 72 hours. Lipid Profile No results for input(s): CHOL, HDL, LDLCALC, TRIG, CHOLHDL, LDLDIRECT in the last 72 hours. Thyroid function studies No results for input(s): TSH, T4TOTAL, T3FREE, THYROIDAB in the last 72 hours.  Invalid input(s): FREET3 Anemia work up No results for input(s): VITAMINB12, FOLATE, FERRITIN, TIBC, IRON, RETICCTPCT in the last 72 hours. Urinalysis    Component Value Date/Time   BILIRUBINUR negative 03/11/2016 0942   PROTEINUR negative 03/11/2016 0942   UROBILINOGEN 0.2 03/11/2016 0942   NITRITE negative 03/11/2016 0942  LEUKOCYTESUR Negative 03/11/2016 0942   Sepsis Labs Invalid input(s): PROCALCITONIN,  WBC,  LACTICIDVEN Microbiology Recent Results (from the past 240 hour(s))  Resp Panel by RT-PCR (Flu A&B, Covid) Nasopharyngeal Swab     Status: None   Collection Time: 02/08/21  3:43 AM   Specimen: Nasopharyngeal Swab; Nasopharyngeal(NP) swabs in vial transport medium  Result Value Ref Range Status   SARS Coronavirus 2 by RT PCR NEGATIVE NEGATIVE Final    Comment: (NOTE) SARS-CoV-2 target nucleic acids are NOT DETECTED.  The SARS-CoV-2 RNA is generally detectable in upper respiratory specimens during the acute phase of infection. The lowest concentration of SARS-CoV-2 viral copies this assay can detect is 138 copies/mL. A negative result does not preclude SARS-Cov-2 infection and should not be used as the sole basis for treatment or other patient management decisions. A negative result may occur with  improper specimen collection/handling, submission of specimen other than nasopharyngeal swab, presence of viral mutation(s) within the areas targeted by this assay, and inadequate number of viral copies(<138 copies/mL). A negative result must be combined with clinical observations, patient history, and epidemiological information. The expected result  is Negative.  Fact Sheet for Patients:  EntrepreneurPulse.com.au  Fact Sheet for Healthcare Providers:  IncredibleEmployment.be  This test is no t yet approved or cleared by the [place] FDA and  has been authorized for detection and/or diagnosis of SARS-CoV-2 by FDA under an Emergency Use Authorization (EUA). This EUA will remain  in effect (meaning this test can be used) for the duration of the COVID-19 declaration under Section 564(b)(1) of the Act, 21 U.S.C.section 360bbb-3(b)(1), unless the authorization is terminated  or revoked sooner.       Influenza A by PCR NEGATIVE NEGATIVE Final   Influenza B by PCR NEGATIVE NEGATIVE Final    Comment: (NOTE) The Xpert Xpress SARS-CoV-2/FLU/RSV plus assay is intended as an aid in the diagnosis of influenza from Nasopharyngeal swab specimens and should not be used as a sole basis for treatment. Nasal washings and aspirates are unacceptable for Xpert Xpress SARS-CoV-2/FLU/RSV testing.  Fact Sheet for Patients: EntrepreneurPulse.com.au  Fact Sheet for Healthcare Providers: IncredibleEmployment.be  This test is not yet approved or cleared by the [place] FDA and has been authorized for detection and/or diagnosis of SARS-CoV-2 by FDA under an Emergency Use Authorization (EUA). This EUA will remain in effect (meaning this test can be used) for the duration of the COVID-19 declaration under Section 564(b)(1) of the Act, 21 U.S.C. section 360bbb-3(b)(1), unless the authorization is terminated or revoked.  Performed at [facility], [address] [place], [place] [address]      Time coordinating discharge: 35 minutes  SIGNED:   Barb Merino, MD  [facility] 02/09/2021, 1:45 PM

## 2021-02-10 ENCOUNTER — Telehealth: Payer: Self-pay

## 2021-02-10 ENCOUNTER — Encounter: Payer: Self-pay | Admitting: Cardiology

## 2021-02-10 NOTE — Telephone Encounter (Signed)
°  TCM not needed-- pt has transferred records to Dr Sabra Heck at [facility]

## 2021-02-13 DIAGNOSIS — I2584 Coronary atherosclerosis due to calcified coronary lesion: Secondary | ICD-10-CM | POA: Diagnosis not present

## 2021-02-13 DIAGNOSIS — I214 Non-ST elevation (NSTEMI) myocardial infarction: Secondary | ICD-10-CM | POA: Diagnosis not present

## 2021-02-13 DIAGNOSIS — Z951 Presence of aortocoronary bypass graft: Secondary | ICD-10-CM | POA: Diagnosis not present

## 2021-02-13 DIAGNOSIS — I129 Hypertensive chronic kidney disease with stage 1 through stage 4 chronic kidney disease, or unspecified chronic kidney disease: Secondary | ICD-10-CM | POA: Diagnosis not present

## 2021-02-13 DIAGNOSIS — R9431 Abnormal electrocardiogram [ECG] [EKG]: Secondary | ICD-10-CM | POA: Diagnosis not present

## 2021-02-13 DIAGNOSIS — N183 Chronic kidney disease, stage 3 unspecified: Secondary | ICD-10-CM | POA: Diagnosis not present

## 2021-02-13 DIAGNOSIS — I2510 Atherosclerotic heart disease of native coronary artery without angina pectoris: Secondary | ICD-10-CM | POA: Diagnosis not present

## 2021-02-13 DIAGNOSIS — Z20822 Contact with and (suspected) exposure to covid-19: Secondary | ICD-10-CM | POA: Diagnosis not present

## 2021-02-13 DIAGNOSIS — I25711 Atherosclerosis of autologous vein coronary artery bypass graft(s) with angina pectoris with documented spasm: Secondary | ICD-10-CM | POA: Diagnosis not present

## 2021-02-13 DIAGNOSIS — I130 Hypertensive heart and chronic kidney disease with heart failure and stage 1 through stage 4 chronic kidney disease, or unspecified chronic kidney disease: Secondary | ICD-10-CM | POA: Diagnosis not present

## 2021-02-13 DIAGNOSIS — I5022 Chronic systolic (congestive) heart failure: Secondary | ICD-10-CM | POA: Diagnosis not present

## 2021-02-13 DIAGNOSIS — Z955 Presence of coronary angioplasty implant and graft: Secondary | ICD-10-CM | POA: Diagnosis not present

## 2021-02-13 DIAGNOSIS — I25718 Atherosclerosis of autologous vein coronary artery bypass graft(s) with other forms of angina pectoris: Secondary | ICD-10-CM | POA: Diagnosis not present

## 2021-02-13 DIAGNOSIS — R001 Bradycardia, unspecified: Secondary | ICD-10-CM | POA: Diagnosis not present

## 2021-02-13 DIAGNOSIS — E785 Hyperlipidemia, unspecified: Secondary | ICD-10-CM | POA: Diagnosis not present

## 2021-02-13 DIAGNOSIS — I25118 Atherosclerotic heart disease of native coronary artery with other forms of angina pectoris: Secondary | ICD-10-CM | POA: Diagnosis not present

## 2021-02-18 DIAGNOSIS — I5022 Chronic systolic (congestive) heart failure: Secondary | ICD-10-CM | POA: Diagnosis not present

## 2021-02-18 DIAGNOSIS — I2510 Atherosclerotic heart disease of native coronary artery without angina pectoris: Secondary | ICD-10-CM | POA: Diagnosis not present

## 2021-02-18 DIAGNOSIS — E782 Mixed hyperlipidemia: Secondary | ICD-10-CM | POA: Diagnosis not present

## 2021-02-18 DIAGNOSIS — Z955 Presence of coronary angioplasty implant and graft: Secondary | ICD-10-CM | POA: Diagnosis not present

## 2021-02-18 DIAGNOSIS — I10 Essential (primary) hypertension: Secondary | ICD-10-CM | POA: Diagnosis not present

## 2021-02-18 DIAGNOSIS — Z23 Encounter for immunization: Secondary | ICD-10-CM | POA: Diagnosis not present

## 2021-02-18 DIAGNOSIS — Z95820 Peripheral vascular angioplasty status with implants and grafts: Secondary | ICD-10-CM | POA: Diagnosis not present

## 2021-02-18 DIAGNOSIS — I340 Nonrheumatic mitral (valve) insufficiency: Secondary | ICD-10-CM | POA: Diagnosis not present

## 2021-02-23 DIAGNOSIS — Z951 Presence of aortocoronary bypass graft: Secondary | ICD-10-CM | POA: Diagnosis not present

## 2021-02-23 DIAGNOSIS — I209 Angina pectoris, unspecified: Secondary | ICD-10-CM | POA: Diagnosis not present

## 2021-02-23 DIAGNOSIS — I25711 Atherosclerosis of autologous vein coronary artery bypass graft(s) with angina pectoris with documented spasm: Secondary | ICD-10-CM | POA: Diagnosis not present

## 2021-02-26 ENCOUNTER — Ambulatory Visit: Payer: PPO | Admitting: Dermatology

## 2021-05-05 DIAGNOSIS — Z800 Family history of malignant neoplasm of digestive organs: Secondary | ICD-10-CM | POA: Diagnosis not present

## 2021-05-05 DIAGNOSIS — Z86010 Personal history of colonic polyps: Secondary | ICD-10-CM | POA: Diagnosis not present

## 2021-05-05 DIAGNOSIS — Z955 Presence of coronary angioplasty implant and graft: Secondary | ICD-10-CM | POA: Diagnosis not present

## 2021-05-12 DIAGNOSIS — E782 Mixed hyperlipidemia: Secondary | ICD-10-CM | POA: Diagnosis not present

## 2021-05-12 DIAGNOSIS — I5022 Chronic systolic (congestive) heart failure: Secondary | ICD-10-CM | POA: Diagnosis not present

## 2021-05-12 DIAGNOSIS — E538 Deficiency of other specified B group vitamins: Secondary | ICD-10-CM | POA: Diagnosis not present

## 2021-05-12 DIAGNOSIS — Z125 Encounter for screening for malignant neoplasm of prostate: Secondary | ICD-10-CM | POA: Diagnosis not present

## 2021-05-12 DIAGNOSIS — R739 Hyperglycemia, unspecified: Secondary | ICD-10-CM | POA: Diagnosis not present

## 2021-05-12 DIAGNOSIS — Z0000 Encounter for general adult medical examination without abnormal findings: Secondary | ICD-10-CM | POA: Diagnosis not present

## 2021-05-19 DIAGNOSIS — I340 Nonrheumatic mitral (valve) insufficiency: Secondary | ICD-10-CM | POA: Diagnosis not present

## 2021-05-19 DIAGNOSIS — Z955 Presence of coronary angioplasty implant and graft: Secondary | ICD-10-CM | POA: Diagnosis not present

## 2021-05-19 DIAGNOSIS — I6523 Occlusion and stenosis of bilateral carotid arteries: Secondary | ICD-10-CM | POA: Diagnosis not present

## 2021-05-19 DIAGNOSIS — I5022 Chronic systolic (congestive) heart failure: Secondary | ICD-10-CM | POA: Diagnosis not present

## 2021-05-19 DIAGNOSIS — E782 Mixed hyperlipidemia: Secondary | ICD-10-CM | POA: Diagnosis not present

## 2021-05-19 DIAGNOSIS — I25711 Atherosclerosis of autologous vein coronary artery bypass graft(s) with angina pectoris with documented spasm: Secondary | ICD-10-CM | POA: Diagnosis not present

## 2021-05-19 DIAGNOSIS — I10 Essential (primary) hypertension: Secondary | ICD-10-CM | POA: Diagnosis not present

## 2021-05-25 ENCOUNTER — Ambulatory Visit: Payer: PPO | Admitting: Dermatology

## 2021-05-25 ENCOUNTER — Encounter: Payer: Self-pay | Admitting: Dermatology

## 2021-05-25 DIAGNOSIS — C44619 Basal cell carcinoma of skin of left upper limb, including shoulder: Secondary | ICD-10-CM | POA: Diagnosis not present

## 2021-05-25 DIAGNOSIS — D1800 Hemangioma unspecified site: Secondary | ICD-10-CM

## 2021-05-25 DIAGNOSIS — D229 Melanocytic nevi, unspecified: Secondary | ICD-10-CM

## 2021-05-25 DIAGNOSIS — L578 Other skin changes due to chronic exposure to nonionizing radiation: Secondary | ICD-10-CM

## 2021-05-25 DIAGNOSIS — L814 Other melanin hyperpigmentation: Secondary | ICD-10-CM | POA: Diagnosis not present

## 2021-05-25 DIAGNOSIS — L570 Actinic keratosis: Secondary | ICD-10-CM | POA: Diagnosis not present

## 2021-05-25 DIAGNOSIS — Z1283 Encounter for screening for malignant neoplasm of skin: Secondary | ICD-10-CM | POA: Diagnosis not present

## 2021-05-25 DIAGNOSIS — Z85828 Personal history of other malignant neoplasm of skin: Secondary | ICD-10-CM

## 2021-05-25 DIAGNOSIS — D492 Neoplasm of unspecified behavior of bone, soft tissue, and skin: Secondary | ICD-10-CM

## 2021-05-25 DIAGNOSIS — Z872 Personal history of diseases of the skin and subcutaneous tissue: Secondary | ICD-10-CM | POA: Diagnosis not present

## 2021-05-25 DIAGNOSIS — L820 Inflamed seborrheic keratosis: Secondary | ICD-10-CM

## 2021-05-25 DIAGNOSIS — L821 Other seborrheic keratosis: Secondary | ICD-10-CM

## 2021-05-25 NOTE — Patient Instructions (Addendum)
Wound Care Instructions ? ?Cleanse wound gently with soap and water once a day then pat dry with clean gauze. Apply a thing coat of Petrolatum (petroleum jelly, "Vaseline") over the wound (unless you have an allergy to this). We recommend that you use a new, sterile tube of Vaseline. Do not pick or remove scabs. Do not remove the yellow or white "healing tissue" from the base of the wound. ? ?Cover the wound with fresh, clean, nonstick gauze and secure with paper tape. You may use Band-Aids in place of gauze and tape if the would is small enough, but would recommend trimming much of the tape off as there is often too much. Sometimes Band-Aids can irritate the skin. ? ?You should call the office for your biopsy report after 1 week if you have not already been contacted. ? ?If you experience any problems, such as abnormal amounts of bleeding, swelling, significant bruising, significant pain, or evidence of infection, please call the office immediately. ? ?FOR ADULT SURGERY PATIENTS: If you need something for pain relief you may take 1 extra strength Tylenol (acetaminophen) AND 2 Ibuprofen ('200mg'$  each) together every 4 hours as needed for pain. (do not take these if you are allergic to them or if you have a reason you should not take them.) Typically, you may only need pain medication for 1 to 3 days.  ? ? ? ? ? ?Cryotherapy Aftercare ? ?Wash gently with soap and water everyday.   ?Apply Vaseline and Band-Aid daily until healed.  ? ?Prior to procedure, discussed risks of blister formation, small wound, skin dyspigmentation, or rare scar following cryotherapy. Recommend Vaseline ointment to treated areas while healing.  ? ?Recommend daily broad spectrum sunscreen SPF 30+ to sun-exposed areas, reapply every 2 hours as needed. Call for new or changing lesions.  ?Staying in the shade or wearing long sleeves, sun glasses (UVA+UVB protection) and wide brim hats (4-inch brim around the entire circumference of the hat) are  also recommended for sun protection.  ? ? ?Melanoma ABCDEs ? ?Melanoma is the most dangerous type of skin cancer, and is the leading cause of death from skin disease.  You are more likely to develop melanoma if you: ?Have light-colored skin, light-colored eyes, or red or blond hair ?Spend a lot of time in the sun ?Tan regularly, either outdoors or in a tanning bed ?Have had blistering sunburns, especially during childhood ?Have a close family member who has had a melanoma ?Have atypical moles or large birthmarks ? ?Early detection of melanoma is key since treatment is typically straightforward and cure rates are extremely high if we catch it early.  ? ?The first sign of melanoma is often a change in a mole or a new dark spot.  The ABCDE system is a way of remembering the signs of melanoma. ? ?A for asymmetry:  The two halves do not match. ?B for border:  The edges of the growth are irregular. ?C for color:  A mixture of colors are present instead of an even brown color. ?D for diameter:  Melanomas are usually (but not always) greater than 43m - the size of a pencil eraser. ?E for evolution:  The spot keeps changing in size, shape, and color. ? ?Please check your skin once per month between visits. You can use a small mirror in front and a large mirror behind you to keep an eye on the back side or your body.  ? ?If you see any new or changing lesions before  your next follow-up, please call to schedule a visit. ? ?Please continue daily skin protection including broad spectrum sunscreen SPF 30+ to sun-exposed areas, reapplying every 2 hours as needed when you're outdoors.  ? ?Staying in the shade or wearing long sleeves, sun glasses (UVA+UVB protection) and wide brim hats (4-inch brim around the entire circumference of the hat) are also recommended for sun protection.   ? ? ?If You Need Anything After Your Visit ? ?If you have any questions or concerns for your doctor, please call our main line at [phone] and  press option 4 to reach your doctor's medical assistant. If no one answers, please leave a voicemail as directed and we will return your call as soon as possible. Messages left after 4 pm will be answered the following business day.  ? ?You may also send [place] a message via MyChart. We typically respond to MyChart messages within 1-2 business days. ? ?For prescription refills, please ask your pharmacy to contact our office. Our fax number is [phone]. ? ?If you have an urgent issue when the clinic is closed that cannot wait until the next business day, you can page your doctor at the number below.   ? ?Please note that while we do our best to be available for urgent issues outside of office hours, we are not available 24/7.  ? ?If you have an urgent issue and are unable to reach [place], you may choose to seek medical care at your doctor's office, retail clinic, urgent care center, or emergency room. ? ?If you have a medical emergency, please immediately call 911 or go to the emergency department. ? ?Pager Numbers ? ?- Dr. Nehemiah Massed: [phone] ? ?- Dr. Laurence Ferrari: [phone] ? ?- Dr. Nicole Kindred: [phone] ? ?In the event of inclement weather, please call our main line at [phone] for an update on the status of any delays or closures. ? ?Dermatology Medication Tips: ?Please keep the boxes that topical medications come in in order to help keep track of the instructions about where and how to use these. Pharmacies typically print the medication instructions only on the boxes and not directly on the medication tubes.  ? ?If your medication is too expensive, please contact our office at [phone] option 4 or send [place] a message through [place].  ? ?We are unable to tell what your co-pay for medications will be in advance as this is different depending on your insurance coverage. However, we may be able to find a substitute medication at lower cost or fill out paperwork to get insurance to cover a needed medication.  ? ?If  a prior authorization is required to get your medication covered by your insurance company, please allow [place] 1-2 business days to complete this process. ? ?Drug prices often vary depending on where the prescription is filled and some pharmacies may offer cheaper prices. ? ?The website www.goodrx.com contains coupons for medications through different pharmacies. The prices here do not account for what the cost may be with help from insurance (it may be cheaper with your insurance), but the website can give you the price if you did not use any insurance.  ?- You can print the associated coupon and take it with your prescription to the pharmacy.  ?- You may also stop by our office during regular business hours and pick up a GoodRx coupon card.  ?- If you need your prescription sent electronically to a different pharmacy, notify our office through [facility] or by phone at  [phone] option 4. ? ? ? ? ?Si Usted Necesita Algo Despu?s de Su Visita ? ?Tambi?n puede enviarnos un mensaje a trav?s de MyChart. Por lo general respondemos a los mensajes de MyChart en el transcurso de 1 a 2 d?as h?biles. ? ?Para renovar recetas, por favor pida a su farmacia que se ponga en contacto con nuestra oficina. Nuestro n?mero de fax es el [phone]. ? ?Si tiene un asunto urgente cuando la cl?nica est? cerrada y que no puede esperar hasta el siguiente d?a h?bil, puede llamar/localizar a su doctor(a) al n?mero que aparece a continuaci?n.  ? ?Por favor, tenga en cuenta que aunque hacemos todo lo posible para estar disponibles para asuntos urgentes fuera del horario de oficina, no estamos disponibles las 24 horas del d?a, los 7 d?as de la semana.  ? ?Si tiene un problema urgente y no puede comunicarse con nosotros, puede optar por buscar atenci?n m?dica  en el consultorio de su doctor(a), en una cl?nica privada, en un centro de atenci?n urgente o en una sala de emergencias. ? ?Si tiene Engineer, maintenance (IT) m?dica, por favor llame  inmediatamente al 911 o vaya a la sala de emergencias. ? ?N?meros de b?per ? ?- Dr. Nehemiah Massed: [phone] ? ?- Dra. Moye: [phone] ? ?- Dra. Nicole Kindred: [phone] ? ?En caso de inclemencias del tiempo, por

## 2021-05-25 NOTE — Progress Notes (Signed)
? ?Follow-Up Visit ?  ?Subjective  ?Joseph Cooper is a 71 y.o. male who presents for the following: Annual Exam (Here for skin cancer screening. Full body. Hx of multiple BCC's. Hx of Aks). ?The patient presents for Total-Body Skin Exam (TBSE) for skin cancer screening and mole check.  The patient has spots, moles and lesions to be evaluated, some may be new or changing and the patient has concerns that these could be cancer. ? ?The following portions of the chart were reviewed this encounter and updated as appropriate:  Tobacco  Allergies  Meds  Problems  Med Hx  Surg Hx  Fam Hx   ?  ?Review of Systems: No other skin or systemic complaints except as noted in HPI or Assessment and Plan. ? ?Objective  ?Well appearing patient in no apparent distress; mood and affect are within normal limits. ? ?A full examination was performed including scalp, head, eyes, ears, nose, lips, neck, chest, axillae, abdomen, back, buttocks, bilateral upper extremities, bilateral lower extremities, hands, feet, fingers, toes, fingernails, and toenails. All findings within normal limits unless otherwise noted below. ? ?Left Ear and face x15 (15) ?Erythematous thin papules/macules with gritty scale.  ? ?Left forearm x1 ?Erythematous keratotic or waxy stuck-on papule or plaque. ? ?Left Shoulder - Anterior ?1 x 0.6 cm pink papule ? ? ? ? ?right nasal tip ?Clear today. ? ? ?Assessment & Plan  ? ?History of Basal Cell Carcinoma of the Skin. Multiple sites, see history. ?- No evidence of recurrence today ?- Recommend regular full body skin exams ?- Recommend daily broad spectrum sunscreen SPF 30+ to sun-exposed areas, reapply every 2 hours as needed.  ?- Call if any new or changing lesions are noted between office visits ? ?Lentigines ?- Scattered tan macules ?- Due to sun exposure ?- Benign-appearing, observe ?- Recommend daily broad spectrum sunscreen SPF 30+ to sun-exposed areas, reapply every 2 hours as needed. ?- Call for any  changes ? ?Seborrheic Keratoses ?- Stuck-on, waxy, tan-brown papules and/or plaques  ?- Benign-appearing ?- Discussed benign etiology and prognosis. ?- Observe ?- Call for any changes ? ?Melanocytic Nevi ?- Tan-brown and/or pink-flesh-colored symmetric macules and papules ?- Benign appearing on exam today ?- Observation ?- Call clinic for new or changing moles ?- Recommend daily use of broad spectrum spf 30+ sunscreen to sun-exposed areas.  ? ?Hemangiomas ?- Red papules ?- Discussed benign nature ?- Observe ?- Call for any changes ? ?Actinic Damage ?- Chronic condition, secondary to cumulative UV/sun exposure ?- diffuse scaly erythematous macules with underlying dyspigmentation ?- Recommend daily broad spectrum sunscreen SPF 30+ to sun-exposed areas, reapply every 2 hours as needed.  ?- Staying in the shade or wearing long sleeves, sun glasses (UVA+UVB protection) and wide brim hats (4-inch brim around the entire circumference of the hat) are also recommended for sun protection.  ?- Call for new or changing lesions. ? ?Skin cancer screening performed today. ? ?AK (actinic keratosis) (15) ?Left Ear and face x15 ?Actinic keratoses are precancerous spots that appear secondary to cumulative UV radiation exposure/sun exposure over time. They are chronic with expected duration over 1 year. A portion of actinic keratoses will progress to squamous cell carcinoma of the skin. It is not possible to reliably predict which spots will progress to skin cancer and so treatment is recommended to prevent development of skin cancer. ? ?Recommend daily broad spectrum sunscreen SPF 30+ to sun-exposed areas, reapply every 2 hours as needed.  ?Recommend staying in the shade or  wearing long sleeves, sun glasses (UVA+UVB protection) and wide brim hats (4-inch brim around the entire circumference of the hat). ?Call for new or changing lesions. ? ?Destruction of lesion - Left Ear and face x15 ?Complexity: simple   ?Destruction method:  cryotherapy   ?Informed consent: discussed and consent obtained   ?Timeout:  patient name, date of birth, surgical site, and procedure verified ?Lesion destroyed using liquid nitrogen: Yes   ?Region frozen until ice ball extended beyond lesion: Yes   ?Outcome: patient tolerated procedure well with no complications   ?Post-procedure details: wound care instructions given   ? ?Inflamed seborrheic keratosis ?Left forearm x1 ?Destruction of lesion - Left forearm x1 ?Complexity: simple   ?Destruction method: cryotherapy   ?Informed consent: discussed and consent obtained   ?Timeout:  patient name, date of birth, surgical site, and procedure verified ?Lesion destroyed using liquid nitrogen: Yes   ?Region frozen until ice ball extended beyond lesion: Yes   ?Outcome: patient tolerated procedure well with no complications   ?Post-procedure details: wound care instructions given   ? ?Neoplasm of skin ?Left Shoulder - Anterior ?Skin / nail biopsy ?Type of biopsy: tangential   ?Informed consent: discussed and consent obtained   ?Timeout: patient name, date of birth, surgical site, and procedure verified   ?Procedure prep:  Patient was prepped and draped in usual sterile fashion ?Prep type:  Isopropyl alcohol ?Anesthesia: the lesion was anesthetized in a standard fashion   ?Anesthetic:  1% lidocaine w/ epinephrine 1-100,000 buffered w/ 8.4% NaHCO3 ?Instrument used: flexible razor blade   ?Hemostasis achieved with: pressure, aluminum chloride and electrodesiccation   ?Outcome: patient tolerated procedure well   ?Post-procedure details: sterile dressing applied and wound care instructions given   ?Dressing type: bandage and petrolatum   ? ?Specimen 1 - Surgical pathology ?Differential Diagnosis: R/O BCC ?Check Margins: No ? ?History of actinic keratosis ?right nasal tip ?Resolved after 5FU/Calcipotriene treatment.  ? ?Skin cancer screening ? ?Return in about 6 months (around 11/24/2021) for HxBCC's, AK Follow Up. ? ?I, Emelia Salisbury, CMA, am acting as scribe for Sarina Ser, MD. ?Documentation: I have reviewed the above documentation for accuracy and completeness, and I agree with the above. ? ?Sarina Ser, MD ? ?

## 2021-05-28 DIAGNOSIS — C4491 Basal cell carcinoma of skin, unspecified: Secondary | ICD-10-CM

## 2021-05-28 HISTORY — DX: Basal cell carcinoma of skin, unspecified: C44.91

## 2021-06-01 ENCOUNTER — Telehealth: Payer: Self-pay

## 2021-06-01 NOTE — Telephone Encounter (Signed)
Patient advised bx results showed BCC. Patient scheduled for shave removal/EDC., JS ?

## 2021-06-01 NOTE — Telephone Encounter (Signed)
-----   Message from Ralene Bathe, MD sent at 05/28/2021  1:45 PM EDT ----- ?Diagnosis ?Skin , left shoulder - anterior ?BASAL CELL CARCINOMA, NODULAR AND INFILTRATIVE PATTERNS, BASE INVOLVED ? ?Cancer - BCC ?Schedule for treatment (Shave removal and EDC)  ?

## 2021-06-02 ENCOUNTER — Encounter: Payer: Self-pay | Admitting: Dermatology

## 2021-06-25 ENCOUNTER — Ambulatory Visit (INDEPENDENT_AMBULATORY_CARE_PROVIDER_SITE_OTHER): Payer: PPO | Admitting: Dermatology

## 2021-06-25 DIAGNOSIS — C44619 Basal cell carcinoma of skin of left upper limb, including shoulder: Secondary | ICD-10-CM

## 2021-06-25 DIAGNOSIS — L570 Actinic keratosis: Secondary | ICD-10-CM

## 2021-06-25 DIAGNOSIS — L578 Other skin changes due to chronic exposure to nonionizing radiation: Secondary | ICD-10-CM

## 2021-06-25 NOTE — Patient Instructions (Addendum)
If area at left ear has not went away in 2 months give [place] a call.      Electrodesiccation and Curettage ("Scrape and Burn") Wound Care Instructions  Leave the original bandage on for 24 hours if possible.  If the bandage becomes soaked or soiled before that time, it is OK to remove it and examine the wound.  A small amount of post-operative bleeding is normal.  If excessive bleeding occurs, remove the bandage, place gauze over the site and apply continuous pressure (no peeking) over the area for 30 minutes. If this does not work, please call our clinic as soon as possible or page your doctor if it is after hours.   Once a day, cleanse the wound with soap and water. It is fine to shower. If a thick crust develops you may use a Q-tip dipped into dilute hydrogen peroxide (mix 1:1 with water) to dissolve it.  Hydrogen peroxide can slow the healing process, so use it only as needed.    After washing, apply petroleum jelly (Vaseline) or an antibiotic ointment if your doctor prescribed one for you, followed by a bandage.    For best healing, the wound should be covered with a layer of ointment at all times. If you are not able to keep the area covered with a bandage to hold the ointment in place, this may mean re-applying the ointment several times a day.  Continue this wound care until the wound has healed and is no longer open. It may take several weeks for the wound to heal and close.  Itching and mild discomfort is normal during the healing process.  If you have any discomfort, you can take Tylenol (acetaminophen) or ibuprofen as directed on the bottle. (Please do not take these if you have an allergy to them or cannot take them for another reason).  Some redness, tenderness and white or yellow material in the wound is normal healing.  If the area becomes very sore and red, or develops a thick yellow-green material (pus), it may be infected; please notify us.    Wound healing continues for up to  one year following surgery. It is not unusual to experience pain in the scar from time to time during the interval.  If the pain becomes severe or the scar thickens, you should notify the office.    A slight amount of redness in a scar is expected for the first six months.  After six months, the redness will fade and the scar will soften and fade.  The color difference becomes less noticeable with time.  If there are any problems, return for a post-op surgery check at your earliest convenience.  To improve the appearance of the scar, you can use silicone scar gel, cream, or sheets (such as Mederma or Serica) every night for up to one year. These are available over the counter (without a prescription).  Please call our office at [phone] for any questions or concerns.  If You Need Anything After Your Visit  If you have any questions or concerns for your doctor, please call our main line at [phone] and press option 4 to reach your doctor's medical assistant. If no one answers, please leave a voicemail as directed and we will return your call as soon as possible. Messages left after 4 pm will be answered the following business day.   You may also send [place] a message via [place]. We typically respond to MyChart messages within 1-2 business days.  Actinic keratoses are precancerous spots that appear secondary to cumulative UV radiation exposure/sun exposure over time. They are chronic with expected duration over 1 year. A portion of actinic keratoses will progress to squamous cell carcinoma of the skin. It is not possible to reliably predict which spots will progress to skin cancer and so treatment is recommended to prevent development of skin cancer.  Recommend daily broad spectrum sunscreen SPF 30+ to sun-exposed areas, reapply every 2 hours as needed.  Recommend staying in the shade or wearing long sleeves, sun glasses (UVA+UVB protection) and wide brim hats (4-inch brim around the entire  circumference of the hat). Call for new or changing lesions.   Cryotherapy Aftercare  Wash gently with soap and water everyday.   Apply Vaseline and Band-Aid daily until healed.    For prescription refills, please ask your pharmacy to contact our office. Our fax number is [phone].  If you have an urgent issue when the clinic is closed that cannot wait until the next business day, you can page your doctor at the number below.    Please note that while we do our best to be available for urgent issues outside of office hours, we are not available 24/7.   If you have an urgent issue and are unable to reach [place], you may choose to seek medical care at your doctor's office, retail clinic, urgent care center, or emergency room.  If you have a medical emergency, please immediately call 911 or go to the emergency department.  Pager Numbers  - Dr. Nehemiah Massed: [phone]  - Dr. Laurence Ferrari: [phone]  - Dr. Nicole Kindred: [phone]  In the event of inclement weather, please call our main line at [phone] for an update on the status of any delays or closures.  Dermatology Medication Tips: Please keep the boxes that topical medications come in in order to help keep track of the instructions about where and how to use these. Pharmacies typically print the medication instructions only on the boxes and not directly on the medication tubes.   If your medication is too expensive, please contact our office at [phone] option 4 or send [place] a message through [place].   We are unable to tell what your co-pay for medications will be in advance as this is different depending on your insurance coverage. However, we may be able to find a substitute medication at lower cost or fill out paperwork to get insurance to cover a needed medication.   If a prior authorization is required to get your medication covered by your insurance company, please allow [place] 1-2 business days to complete this process.  Drug  prices often vary depending on where the prescription is filled and some pharmacies may offer cheaper prices.  The website www.goodrx.com contains coupons for medications through different pharmacies. The prices here do not account for what the cost may be with help from insurance (it may be cheaper with your insurance), but the website can give you the price if you did not use any insurance.  - You can print the associated coupon and take it with your prescription to the pharmacy.  - You may also stop by our office during regular business hours and pick up a GoodRx coupon card.  - If you need your prescription sent electronically to a different pharmacy, notify our office through [facility] or by phone at [phone] option 4.     Si Usted Necesita Algo Despus de Su Visita  Tambin puede enviarnos un mensaje  a travs de MyChart. Por lo general respondemos a los mensajes de MyChart en el transcurso de 1 a 2 das hbiles.  Para renovar recetas, por favor pida a su farmacia que se ponga en contacto con nuestra oficina. Harland Dingwall de fax es [place] [phone].  Si tiene un asunto urgente cuando la clnica est cerrada y que no puede esperar hasta el siguiente da hbil, puede llamar/localizar a su doctor(a) al nmero que aparece a continuacin.   Por favor, tenga en cuenta que aunque hacemos todo lo posible para estar disponibles para asuntos urgentes fuera del horario de [place], no estamos disponibles las 24 horas del da, los 7 das de la [place].   Si tiene un problema urgente y no puede comunicarse con nosotros, puede optar por buscar atencin mdica  en el consultorio de su doctor(a), en una clnica privada, en un centro de atencin urgente o en una sala de emergencias.  Si tiene Engineering geologist, por favor llame inmediatamente al 911 o vaya a la sala de emergencias.  Nmeros de bper  - Dr. Nehemiah Massed: [phone]  - Dra. Moye: [phone]  - Dra. Nicole Kindred:  [phone]  En caso de inclemencias del [place], por favor llame a Johnsie Kindred principal al [phone] para una actualizacin sobre el [place] de cualquier retraso o cierre.  Consejos para la medicacin en dermatologa: Por favor, guarde las cajas en las que vienen los medicamentos de uso tpico para ayudarle a seguir las instrucciones sobre dnde y cmo usarlos. Las farmacias generalmente imprimen las instrucciones del medicamento slo en las cajas y no directamente en los tubos del [place].   Si su medicamento es muy caro, por favor, pngase en contacto con Zigmund Daniel llamando al [phone] y presione la opcin 4 o envenos un mensaje a travs de Pharmacist, community.   No podemos decirle cul ser su copago por los medicamentos por adelantado ya que esto es diferente dependiendo de la cobertura de su seguro. Sin embargo, es posible que podamos encontrar un medicamento sustituto a Electrical engineer un formulario para que el seguro cubra el medicamento que se considera necesario.   Si se requiere una autorizacin previa para que su compaa de seguros [place] su medicamento, por favor permtanos de 1 a 2 das hbiles para completar este proceso.  Los precios de los medicamentos varan con frecuencia dependiendo del Environmental consultant de dnde se surte la receta y alguna farmacias pueden ofrecer precios ms baratos.  El sitio web www.goodrx.com tiene cupones para medicamentos de Airline pilot. Los precios aqu no tienen en cuenta lo que podra costar con la ayuda del seguro (puede ser ms barato con su seguro), pero el sitio web puede darle el precio si no utiliz Research scientist (physical sciences).  - Puede imprimir el cupn correspondiente y llevarlo con su receta a la farmacia.  - Tambin puede pasar por nuestra oficina durante el horario de atencin regular y Charity fundraiser una tarjeta de cupones de GoodRx.  - Si necesita que su receta se enve electrnicamente a una farmacia diferente, informe a nuestra oficina a travs de  MyChart de [place] o por telfono llamando al [phone] y presione la opcin 4.

## 2021-06-25 NOTE — Progress Notes (Signed)
Follow-Up Visit   Subjective  Joseph Cooper is a 70 y.o. male who presents for the following: Basal Cell Carcinoma (Patient here for treatment of bx proven bcc . The patient has spots, moles and lesions to be evaluated, some may be new or changing and the patient has concerns that these could be cancer.  The following portions of the chart were reviewed this encounter and updated as appropriate:  Tobacco  Allergies  Meds  Problems  Med Hx  Surg Hx  Fam Hx     Review of Systems: No other skin or systemic complaints except as noted in HPI or Assessment and Plan.  Objective  Well appearing patient in no apparent distress; mood and affect are within normal limits.  A focused examination was performed including left shoulder . Relevant physical exam findings are noted in the Assessment and Plan.  Left Shoulder - Anterior Healing bx   Left Preauricular Area x 2 (2) Erythematous thin papules/macules with gritty scale.    Assessment & Plan  Basal cell carcinoma (BCC) of skin of left upper extremity including shoulder Left Shoulder - Anterior Destruction of lesion Complexity: extensive   Destruction method: electrodesiccation and curettage   Informed consent: discussed and consent obtained   Timeout:  patient name, date of birth, surgical site, and procedure verified Procedure prep:  Patient was prepped and draped in usual sterile fashion Prep type:  Isopropyl alcohol Anesthesia: the lesion was anesthetized in a standard fashion   Anesthetic:  1% lidocaine w/ epinephrine 1-100,000 buffered w/ 8.4% NaHCO3 Curettage performed in three different directions: Yes   Electrodesiccation performed over the curetted area: Yes   Final wound size (cm):  2.5 Hemostasis achieved with:  pressure, aluminum chloride and electrodesiccation Outcome: patient tolerated procedure well with no complications   Post-procedure details: sterile dressing applied and wound care instructions given    Dressing type: bandage and petrolatum    BASAL CELL CARCINOMA, NODULAR AND INFILTRATIVE PATTERNS, BASE INVOLVED Discussed different treatment options. ED&C performed today  Actinic keratosis (2) Left Preauricular Area x 2 Actinic keratoses are precancerous spots that appear secondary to cumulative UV radiation exposure/sun exposure over time. They are chronic with expected duration over 1 year. A portion of actinic keratoses will progress to squamous cell carcinoma of the skin. It is not possible to reliably predict which spots will progress to skin cancer and so treatment is recommended to prevent development of skin cancer. Recommend daily broad spectrum sunscreen SPF 30+ to sun-exposed areas, reapply every 2 hours as needed.  Recommend staying in the shade or wearing long sleeves, sun glasses (UVA+UVB protection) and wide brim hats (4-inch brim around the entire circumference of the hat). Call for new or changing lesions. Destruction of lesion - Left Preauricular Area x 2 Complexity: simple   Destruction method: cryotherapy   Informed consent: discussed and consent obtained   Timeout:  patient name, date of birth, surgical site, and procedure verified Lesion destroyed using liquid nitrogen: Yes   Region frozen until ice ball extended beyond lesion: Yes   Outcome: patient tolerated procedure well with no complications   Post-procedure details: wound care instructions given   Additional details:  Prior to procedure, discussed risks of blister formation, small wound, skin dyspigmentation, or rare scar following cryotherapy. Recommend Vaseline ointment to treated areas while healing.  Actinic Damage - chronic, secondary to cumulative UV radiation exposure/sun exposure over time - diffuse scaly erythematous macules with underlying dyspigmentation - Recommend daily broad spectrum sunscreen  SPF 30+ to sun-exposed areas, reapply every 2 hours as needed.  - Recommend staying in the shade or  wearing long sleeves, sun glasses (UVA+UVB protection) and wide brim hats (4-inch brim around the entire circumference of the hat). - Call for new or changing lesions.  Return if symptoms worsen or fail to improve, for keep follow up as scheduled 11/25/21. IRuthell Rummage, CMA, am acting as scribe for Sarina Ser, MD. Documentation: I have reviewed the above documentation for accuracy and completeness, and I agree with the above.  Sarina Ser, MD

## 2021-06-29 ENCOUNTER — Encounter: Payer: Self-pay | Admitting: Dermatology

## 2021-08-17 DIAGNOSIS — I5022 Chronic systolic (congestive) heart failure: Secondary | ICD-10-CM | POA: Diagnosis not present

## 2021-08-17 DIAGNOSIS — I10 Essential (primary) hypertension: Secondary | ICD-10-CM | POA: Diagnosis not present

## 2021-08-17 DIAGNOSIS — I340 Nonrheumatic mitral (valve) insufficiency: Secondary | ICD-10-CM | POA: Diagnosis not present

## 2021-08-17 DIAGNOSIS — Z955 Presence of coronary angioplasty implant and graft: Secondary | ICD-10-CM | POA: Diagnosis not present

## 2021-08-17 DIAGNOSIS — I6523 Occlusion and stenosis of bilateral carotid arteries: Secondary | ICD-10-CM | POA: Diagnosis not present

## 2021-08-17 DIAGNOSIS — E782 Mixed hyperlipidemia: Secondary | ICD-10-CM | POA: Diagnosis not present

## 2021-08-17 DIAGNOSIS — I25711 Atherosclerosis of autologous vein coronary artery bypass graft(s) with angina pectoris with documented spasm: Secondary | ICD-10-CM | POA: Diagnosis not present

## 2021-08-20 DIAGNOSIS — T675XXA Heat exhaustion, unspecified, initial encounter: Secondary | ICD-10-CM | POA: Diagnosis not present

## 2021-08-20 DIAGNOSIS — R5383 Other fatigue: Secondary | ICD-10-CM | POA: Diagnosis not present

## 2021-08-24 ENCOUNTER — Other Ambulatory Visit: Payer: PPO

## 2021-08-24 DIAGNOSIS — E860 Dehydration: Secondary | ICD-10-CM | POA: Diagnosis not present

## 2021-08-24 DIAGNOSIS — Z021 Encounter for pre-employment examination: Secondary | ICD-10-CM

## 2021-08-24 DIAGNOSIS — R7401 Elevation of levels of liver transaminase levels: Secondary | ICD-10-CM | POA: Diagnosis not present

## 2021-08-24 NOTE — Progress Notes (Signed)
Presents to [facility] for on-site pre-employment drug screen.  [facility] Acct #:  [id number] [facility] Specimen #:  [id number]  Rapid drug screen results = Negative.  AMD

## 2021-09-03 DIAGNOSIS — R7401 Elevation of levels of liver transaminase levels: Secondary | ICD-10-CM | POA: Diagnosis not present

## 2021-09-28 DIAGNOSIS — I5022 Chronic systolic (congestive) heart failure: Secondary | ICD-10-CM | POA: Diagnosis not present

## 2021-09-28 DIAGNOSIS — E782 Mixed hyperlipidemia: Secondary | ICD-10-CM | POA: Diagnosis not present

## 2021-09-28 DIAGNOSIS — I340 Nonrheumatic mitral (valve) insufficiency: Secondary | ICD-10-CM | POA: Diagnosis not present

## 2021-09-28 DIAGNOSIS — I10 Essential (primary) hypertension: Secondary | ICD-10-CM | POA: Diagnosis not present

## 2021-09-28 DIAGNOSIS — Z955 Presence of coronary angioplasty implant and graft: Secondary | ICD-10-CM | POA: Diagnosis not present

## 2021-09-28 DIAGNOSIS — I6523 Occlusion and stenosis of bilateral carotid arteries: Secondary | ICD-10-CM | POA: Diagnosis not present

## 2021-09-28 DIAGNOSIS — I25711 Atherosclerosis of autologous vein coronary artery bypass graft(s) with angina pectoris with documented spasm: Secondary | ICD-10-CM | POA: Diagnosis not present

## 2021-11-19 DIAGNOSIS — R7401 Elevation of levels of liver transaminase levels: Secondary | ICD-10-CM | POA: Diagnosis not present

## 2021-11-25 ENCOUNTER — Ambulatory Visit: Payer: PPO | Admitting: Dermatology

## 2021-11-25 DIAGNOSIS — L821 Other seborrheic keratosis: Secondary | ICD-10-CM

## 2021-11-25 DIAGNOSIS — Z85828 Personal history of other malignant neoplasm of skin: Secondary | ICD-10-CM | POA: Diagnosis not present

## 2021-11-25 DIAGNOSIS — Z79899 Other long term (current) drug therapy: Secondary | ICD-10-CM

## 2021-11-25 DIAGNOSIS — L814 Other melanin hyperpigmentation: Secondary | ICD-10-CM | POA: Diagnosis not present

## 2021-11-25 DIAGNOSIS — Z5111 Encounter for antineoplastic chemotherapy: Secondary | ICD-10-CM | POA: Diagnosis not present

## 2021-11-25 DIAGNOSIS — L578 Other skin changes due to chronic exposure to nonionizing radiation: Secondary | ICD-10-CM

## 2021-11-25 DIAGNOSIS — L570 Actinic keratosis: Secondary | ICD-10-CM

## 2021-11-25 NOTE — Progress Notes (Signed)
Follow-Up Visit   Subjective  Joseph Cooper is a 71 y.o. male who presents for the following: Actinic Keratosis (6 month follow up - face and left ear treated with LN2). The patient has spots, moles and lesions to be evaluated, some may be new or changing and the patient has concerns that these could be cancer.  The following portions of the chart were reviewed this encounter and updated as appropriate:   Tobacco  Allergies  Meds  Problems  Med Hx  Surg Hx  Fam Hx     Review of Systems:  No other skin or systemic complaints except as noted in HPI or Assessment and Plan.  Objective  Well appearing patient in no apparent distress; mood and affect are within normal limits.  A focused examination was performed including scalp, face, arms. Relevant physical exam findings are noted in the Assessment and Plan.  Left forehead x 1, left ear x 1, scalp x 5, left shoulder x 1 (8) Erythematous thin papules/macules with gritty scale.    Assessment & Plan   History of Basal Cell Carcinoma of the Skin - No evidence of recurrence today - Recommend regular full body skin exams - Recommend daily broad spectrum sunscreen SPF 30+ to sun-exposed areas, reapply every 2 hours as needed.  - Call if any new or changing lesions are noted between office visits  Seborrheic Keratoses - Stuck-on, waxy, tan-brown papules and/or plaques  - Benign-appearing - Discussed benign etiology and prognosis. - Observe - Call for any changes  Lentigines - Scattered tan macules - Due to sun exposure - Benign-appearing, observe - Recommend daily broad spectrum sunscreen SPF 30+ to sun-exposed areas, reapply every 2 hours as needed. - Call for any changes   AK (actinic keratosis) (8) Left forehead x 1, left ear x 1, scalp x 5, left shoulder x 1  Actinic Damage - Severe, confluent actinic changes with pre-cancerous actinic keratoses  - Severe, chronic, not at goal, secondary to cumulative UV radiation  exposure over time - diffuse scaly erythematous macules and papules with underlying dyspigmentation - Discussed Prescription "Field Treatment" for Severe, Chronic Confluent Actinic Changes with Pre-Cancerous Actinic Keratoses Field treatment involves treatment of an entire area of skin that has confluent Actinic Changes (Sun/ Ultraviolet light damage) and PreCancerous Actinic Keratoses by method of PhotoDynamic Therapy (PDT) and/or prescription Topical Chemotherapy agents such as 5-fluorouracil, 5-fluorouracil/calcipotriene, and/or imiquimod.  The purpose is to decrease the number of clinically evident and subclinical PreCancerous lesions to prevent progression to development of skin cancer by chemically destroying early precancer changes that may or may not be visible.  It has been shown to reduce the risk of developing skin cancer in the treated area. As a result of treatment, redness, scaling, crusting, and open sores may occur during treatment course. One or more than one of these methods may be used and may have to be used several times to control, suppress and eliminate the PreCancerous changes. Discussed treatment course, expected reaction, and possible side effects. - Recommend daily broad spectrum sunscreen SPF 30+ to sun-exposed areas, reapply every 2 hours as needed.  - Staying in the shade or wearing long sleeves, sun glasses (UVA+UVB protection) and wide brim hats (4-inch brim around the entire circumference of the hat) are also recommended. - Call for new or changing lesions.  In 3 weeks, start Fluorouracil 5%/Calcipotriene cream bid x 7 days to scalp  Destruction of lesion - Left forehead x 1, left ear x 1, scalp  x 5, left shoulder x 1 Complexity: simple   Destruction method: cryotherapy   Informed consent: discussed and consent obtained   Timeout:  patient name, date of birth, surgical site, and procedure verified Lesion destroyed using liquid nitrogen: Yes   Region frozen until ice  ball extended beyond lesion: Yes   Outcome: patient tolerated procedure well with no complications   Post-procedure details: wound care instructions given     Return in about 6 months (around 05/27/2022) for [place] follow up.  I, Ashok Cordia, CMA, am acting as scribe for Sarina Ser, MD . Documentation: I have reviewed the above documentation for accuracy and completeness, and I agree with the above.  Sarina Ser, MD

## 2021-11-25 NOTE — Patient Instructions (Signed)
In 3 weeks, start Fluorouracil 5%/Calcipotriene cream twice daily x 7 days to scalp   Cryotherapy Aftercare  Wash gently with soap and water everyday.   Apply Vaseline and Band-Aid daily until healed.     Due to recent changes in healthcare laws, you may see results of your pathology and/or laboratory studies on MyChart before the doctors have had a chance to review them. We understand that in some cases there may be results that are confusing or concerning to you. Please understand that not all results are received at the same time and often the doctors may need to interpret multiple results in order to provide you with the best plan of care or course of treatment. Therefore, we ask that you please give [place] 2 business days to thoroughly review all your results before contacting the office for clarification. Should we see a critical lab result, you will be contacted sooner.   If You Need Anything After Your Visit  If you have any questions or concerns for your doctor, please call our main line at [phone] and press option 4 to reach your doctor's medical assistant. If no one answers, please leave a voicemail as directed and we will return your call as soon as possible. Messages left after 4 pm will be answered the following business day.   You may also send [place] a message via [place]. We typically respond to MyChart messages within 1-2 business days.  For prescription refills, please ask your pharmacy to contact our office. Our fax number is [phone].  If you have an urgent issue when the clinic is closed that cannot wait until the next business day, you can page your doctor at the number below.    Please note that while we do our best to be available for urgent issues outside of office hours, we are not available 24/7.   If you have an urgent issue and are unable to reach [place], you may choose to seek medical care at your doctor's office, retail clinic, urgent care center, or emergency  room.  If you have a medical emergency, please immediately call 911 or go to the emergency department.  Pager Numbers  - Dr. Nehemiah Massed: [phone]  - Dr. Laurence Ferrari: [phone]  - Dr. Nicole Kindred: [phone]  In the event of inclement weather, please call our main line at [phone] for an update on the status of any delays or closures.  Dermatology Medication Tips: Please keep the boxes that topical medications come in in order to help keep track of the instructions about where and how to use these. Pharmacies typically print the medication instructions only on the boxes and not directly on the medication tubes.   If your medication is too expensive, please contact our office at [phone] option 4 or send [place] a message through [place].   We are unable to tell what your co-pay for medications will be in advance as this is different depending on your insurance coverage. However, we may be able to find a substitute medication at lower cost or fill out paperwork to get insurance to cover a needed medication.   If a prior authorization is required to get your medication covered by your insurance company, please allow [place] 1-2 business days to complete this process.  Drug prices often vary depending on where the prescription is filled and some pharmacies may offer cheaper prices.  The website www.goodrx.com contains coupons for medications through different pharmacies. The prices here do not account for what the cost may  be with help from insurance (it may be cheaper with your insurance), but the website can give you the price if you did not use any insurance.  - You can print the associated coupon and take it with your prescription to the pharmacy.  - You may also stop by our office during regular business hours and pick up a GoodRx coupon card.  - If you need your prescription sent electronically to a different pharmacy, notify our office through [facility] or by phone at [phone]  option 4.     Si Usted Necesita Algo Despus de Su Visita  Tambin puede enviarnos un mensaje a travs de Pharmacist, community. Por lo general respondemos a los mensajes de MyChart en el transcurso de 1 a 2 das hbiles.  Para renovar recetas, por favor pida a su farmacia que se ponga en contacto con nuestra oficina. Harland Dingwall de fax es [place] [phone].  Si tiene un asunto urgente cuando la clnica est cerrada y que no puede esperar hasta el siguiente da hbil, puede llamar/localizar a su doctor(a) al nmero que aparece a continuacin.   Por favor, tenga en cuenta que aunque hacemos todo lo posible para estar disponibles para asuntos urgentes fuera del horario de [place], no estamos disponibles las 24 horas del da, los 7 das de la [place].   Si tiene un problema urgente y no puede comunicarse con nosotros, puede optar por buscar atencin mdica  en el consultorio de su doctor(a), en una clnica privada, en un centro de atencin urgente o en una sala de emergencias.  Si tiene Engineering geologist, por favor llame inmediatamente al 911 o vaya a la sala de emergencias.  Nmeros de bper  - Dr. Nehemiah Massed: [phone]  - Dra. Moye: [phone]  - Dra. Nicole Kindred: [phone]  En caso de inclemencias del [place], por favor llame a Johnsie Kindred principal al [phone] para una actualizacin sobre el [place] de cualquier retraso o cierre.  Consejos para la medicacin en dermatologa: Por favor, guarde las cajas en las que vienen los medicamentos de uso tpico para ayudarle a seguir las instrucciones sobre dnde y cmo usarlos. Las farmacias generalmente imprimen las instrucciones del medicamento slo en las cajas y no directamente en los tubos del [place].   Si su medicamento es muy caro, por favor, pngase en contacto con Zigmund Daniel llamando al [phone] y presione la opcin 4 o envenos un mensaje a travs de Pharmacist, community.   No podemos decirle cul ser su copago por los medicamentos  por adelantado ya que esto es diferente dependiendo de la cobertura de su seguro. Sin embargo, es posible que podamos encontrar un medicamento sustituto a Electrical engineer un formulario para que el seguro cubra el medicamento que se considera necesario.   Si se requiere una autorizacin previa para que su compaa de seguros [place] su medicamento, por favor permtanos de 1 a 2 das hbiles para completar este proceso.  Los precios de los medicamentos varan con frecuencia dependiendo del Environmental consultant de dnde se surte la receta y alguna farmacias pueden ofrecer precios ms baratos.  El sitio web www.goodrx.com tiene cupones para medicamentos de Airline pilot. Los precios aqu no tienen en cuenta lo que podra costar con la ayuda del seguro (puede ser ms barato con su seguro), pero el sitio web puede darle el precio si no utiliz Research scientist (physical sciences).  - Puede imprimir el cupn correspondiente y llevarlo con su receta a la farmacia.  - Tambin puede pasar por Zigmund Daniel  durante el horario de atencin regular y Charity fundraiser una tarjeta de cupones de GoodRx.  - Si necesita que su receta se enve electrnicamente a una farmacia diferente, informe a nuestra oficina a travs de MyChart de [place] o por telfono llamando al [phone] y presione la opcin 4.

## 2021-11-30 ENCOUNTER — Encounter: Payer: Self-pay | Admitting: Dermatology

## 2021-12-30 DIAGNOSIS — Z955 Presence of coronary angioplasty implant and graft: Secondary | ICD-10-CM | POA: Diagnosis not present

## 2021-12-30 DIAGNOSIS — E782 Mixed hyperlipidemia: Secondary | ICD-10-CM | POA: Diagnosis not present

## 2021-12-30 DIAGNOSIS — I340 Nonrheumatic mitral (valve) insufficiency: Secondary | ICD-10-CM | POA: Diagnosis not present

## 2021-12-30 DIAGNOSIS — I10 Essential (primary) hypertension: Secondary | ICD-10-CM | POA: Diagnosis not present

## 2021-12-30 DIAGNOSIS — I5022 Chronic systolic (congestive) heart failure: Secondary | ICD-10-CM | POA: Diagnosis not present

## 2022-02-24 ENCOUNTER — Ambulatory Visit: Payer: PPO | Admitting: Dermatology

## 2022-02-24 VITALS — BP 122/71 | HR 61

## 2022-02-24 DIAGNOSIS — B079 Viral wart, unspecified: Secondary | ICD-10-CM

## 2022-02-24 DIAGNOSIS — D229 Melanocytic nevi, unspecified: Secondary | ICD-10-CM | POA: Diagnosis not present

## 2022-02-24 DIAGNOSIS — L820 Inflamed seborrheic keratosis: Secondary | ICD-10-CM

## 2022-02-24 DIAGNOSIS — L821 Other seborrheic keratosis: Secondary | ICD-10-CM | POA: Diagnosis not present

## 2022-02-24 DIAGNOSIS — L814 Other melanin hyperpigmentation: Secondary | ICD-10-CM | POA: Diagnosis not present

## 2022-02-24 DIAGNOSIS — L578 Other skin changes due to chronic exposure to nonionizing radiation: Secondary | ICD-10-CM

## 2022-02-24 NOTE — Patient Instructions (Signed)
Due to recent changes in healthcare laws, you may see results of your pathology and/or laboratory studies on MyChart before the doctors have had a chance to review them. We understand that in some cases there may be results that are confusing or concerning to you. Please understand that not all results are received at the same time and often the doctors may need to interpret multiple results in order to provide you with the best plan of care or course of treatment. Therefore, we ask that you please give us 2 business days to thoroughly review all your results before contacting the office for clarification. Should we see a critical lab result, you will be contacted sooner.   If You Need Anything After Your Visit  If you have any questions or concerns for your doctor, please call our main line at [phone] and press option 4 to reach your doctor's medical assistant. If no one answers, please leave a voicemail as directed and we will return your call as soon as possible. Messages left after 4 pm will be answered the following business day.   You may also send us a message via MyChart. We typically respond to MyChart messages within 1-2 business days.  For prescription refills, please ask your pharmacy to contact our office. Our fax number is [phone].  If you have an urgent issue when the clinic is closed that cannot wait until the next business day, you can page your doctor at the number below.    Please note that while we do our best to be available for urgent issues outside of office hours, we are not available 24/7.   If you have an urgent issue and are unable to reach us, you may choose to seek medical care at your doctor's office, retail clinic, urgent care center, or emergency room.  If you have a medical emergency, please immediately call 911 or go to the emergency department.  Pager Numbers  - Dr. Kowalski: [phone]  - Dr. Moye: [phone]  - Dr. Stewart:  [phone]  In the event of inclement weather, please call our main line at [phone] for an update on the status of any delays or closures.  Dermatology Medication Tips: Please keep the boxes that topical medications come in in order to help keep track of the instructions about where and how to use these. Pharmacies typically print the medication instructions only on the boxes and not directly on the medication tubes.   If your medication is too expensive, please contact our office at [phone] option 4 or send us a message through MyChart.   We are unable to tell what your co-pay for medications will be in advance as this is different depending on your insurance coverage. However, we may be able to find a substitute medication at lower cost or fill out paperwork to get insurance to cover a needed medication.   If a prior authorization is required to get your medication covered by your insurance company, please allow us 1-2 business days to complete this process.  Drug prices often vary depending on where the prescription is filled and some pharmacies may offer cheaper prices.  The website www.goodrx.com contains coupons for medications through different pharmacies. The prices here do not account for what the cost may be with help from insurance (it may be cheaper with your insurance), but the website can give you the price if you did not use any insurance.  - You can print the associated coupon and take it with   your prescription to the pharmacy.  - You may also stop by our office during regular business hours and pick up a GoodRx coupon card.  - If you need your prescription sent electronically to a different pharmacy, notify our office through [place] MyChart or by phone at [phone] option 4.     Si Usted Necesita Algo Despus de Su Visita  Tambin puede enviarnos un mensaje a travs de MyChart. Por lo general respondemos a los mensajes de MyChart en el transcurso de 1 a 2  das hbiles.  Para renovar recetas, por favor pida a su farmacia que se ponga en contacto con nuestra oficina. Nuestro nmero de fax es el [phone].  Si tiene un asunto urgente cuando la clnica est cerrada y que no puede esperar hasta el siguiente da hbil, puede llamar/localizar a su doctor(a) al nmero que aparece a continuacin.   Por favor, tenga en cuenta que aunque hacemos todo lo posible para estar disponibles para asuntos urgentes fuera del horario de oficina, no estamos disponibles las 24 horas del da, los 7 das de la semana.   Si tiene un problema urgente y no puede comunicarse con nosotros, puede optar por buscar atencin mdica  en el consultorio de su doctor(a), en una clnica privada, en un centro de atencin urgente o en una sala de emergencias.  Si tiene una emergencia mdica, por favor llame inmediatamente al 911 o vaya a la sala de emergencias.  Nmeros de bper  - Dr. Kowalski: [phone]  - Dra. Moye: [phone]  - Dra. Stewart: [phone]  En caso de inclemencias del tiempo, por favor llame a nuestra lnea principal al [phone] para una actualizacin sobre el estado de cualquier retraso o cierre.  Consejos para la medicacin en dermatologa: Por favor, guarde las cajas en las que vienen los medicamentos de uso tpico para ayudarle a seguir las instrucciones sobre dnde y cmo usarlos. Las farmacias generalmente imprimen las instrucciones del medicamento slo en las cajas y no directamente en los tubos del medicamento.   Si su medicamento es muy caro, por favor, pngase en contacto con nuestra oficina llamando al [phone] y presione la opcin 4 o envenos un mensaje a travs de MyChart.   No podemos decirle cul ser su copago por los medicamentos por adelantado ya que esto es diferente dependiendo de la cobertura de su seguro. Sin embargo, es posible que podamos encontrar un medicamento sustituto a menor costo o llenar un formulario para que el  seguro cubra el medicamento que se considera necesario.   Si se requiere una autorizacin previa para que su compaa de seguros cubra su medicamento, por favor permtanos de 1 a 2 das hbiles para completar este proceso.  Los precios de los medicamentos varan con frecuencia dependiendo del lugar de dnde se surte la receta y alguna farmacias pueden ofrecer precios ms baratos.  El sitio web www.goodrx.com tiene cupones para medicamentos de diferentes farmacias. Los precios aqu no tienen en cuenta lo que podra costar con la ayuda del seguro (puede ser ms barato con su seguro), pero el sitio web puede darle el precio si no utiliz ningn seguro.  - Puede imprimir el cupn correspondiente y llevarlo con su receta a la farmacia.  - Tambin puede pasar por nuestra oficina durante el horario de atencin regular y recoger una tarjeta de cupones de GoodRx.  - Si necesita que su receta se enve electrnicamente a una farmacia diferente, informe a nuestra oficina a travs de MyChart de [place]   o por telfono llamando al [phone] y presione la opcin 4.  

## 2022-02-24 NOTE — Progress Notes (Signed)
Follow-Up Visit   Subjective  Joseph Cooper is a 72 y.o. male who presents for the following: Irregular skin lesions (On the L shoulder and R side - patient is concerned and would like lesions checked today ). The patient has spots, moles and lesions to be evaluated, some may be new or changing and the patient has concerns that these could be cancer.  The following portions of the chart were reviewed this encounter and updated as appropriate:   Tobacco  Allergies  Meds  Problems  Med Hx  Surg Hx  Fam Hx     Review of Systems:  No other skin or systemic complaints except as noted in HPI or Assessment and Plan.  Objective  Well appearing patient in no apparent distress; mood and affect are within normal limits.  All skin waist up examined.  Back x 3, R side x 2 (5) Erythematous stuck-on, waxy papule or plaque  L plantar surface x 1 Verrucous papules -- Discussed viral etiology and contagion. Indurated area 1.7 x 1.2 cm, warty area 0.7 cm.   Assessment & Plan  Inflamed seborrheic keratosis (5) Back x 3, R side x 2 Symptomatic, irritating, patient would like treated. Destruction of lesion - Back x 3, R side x 2 Complexity: simple   Destruction method: cryotherapy   Informed consent: discussed and consent obtained   Timeout:  patient name, date of birth, surgical site, and procedure verified Lesion destroyed using liquid nitrogen: Yes   Region frozen until ice ball extended beyond lesion: Yes   Outcome: patient tolerated procedure well with no complications   Post-procedure details: wound care instructions given    Viral warts, unspecified type L plantar surface x 1 Viral Wart (HPV) Counseling  Discussed viral / HPV (Human Papilloma Virus) etiology and risk of spread /infectivity to other areas of body as well as to other people.  Multiple treatments and methods may be required to clear warts and it is possible treatment may not be successful.  Treatment risks include  discoloration; scarring and there is still potential for wart recurrence.  Destruction of lesion - L plantar surface x 1 Complexity: simple   Destruction method: cryotherapy   Informed consent: discussed and consent obtained   Timeout:  patient name, date of birth, surgical site, and procedure verified Lesion destroyed using liquid nitrogen: Yes   Region frozen until ice ball extended beyond lesion: Yes   Outcome: patient tolerated procedure well with no complications   Post-procedure details: wound care instructions given    Destruction of lesion - L plantar surface x 1  Destruction method: chemical removal   Informed consent: discussed and consent obtained   Timeout:  patient name, date of birth, surgical site, and procedure verified Chemical destruction method: cantharidin   Chemical destruction method comment:  Squaric acid 3%, cantharidin plus Application time:  4 hours Procedure instructions: patient instructed to wash and dry area   Outcome: patient tolerated procedure well with no complications   Post-procedure details: wound care instructions given   Additional details:  Patient advised to set alarm to remind them to wash off with soap and water at the directed time.  Lentigines - Scattered tan macules - Due to sun exposure - Benign-appearing, observe - Recommend daily broad spectrum sunscreen SPF 30+ to sun-exposed areas, reapply every 2 hours as needed. - Call for any changes  Seborrheic Keratoses - Stuck-on, waxy, tan-brown papules and/or plaques  - Benign-appearing - Discussed benign etiology and prognosis. -  Observe - Call for any changes  Melanocytic Nevi - Tan-brown and/or pink-flesh-colored symmetric macules and papules - Benign appearing on exam today - Observation - Call clinic for new or changing moles - Recommend daily use of broad spectrum spf 30+ sunscreen to sun-exposed areas.   Hemangiomas - Red papules - Discussed benign nature - Observe -  Call for any changes  Actinic Damage - Chronic condition, secondary to cumulative UV/sun exposure - diffuse scaly erythematous macules with underlying dyspigmentation - Recommend daily broad spectrum sunscreen SPF 30+ to sun-exposed areas, reapply every 2 hours as needed.  - Staying in the shade or wearing long sleeves, sun glasses (UVA+UVB protection) and wide brim hats (4-inch brim around the entire circumference of the hat) are also recommended for sun protection.  - Call for new or changing lesions.  Return for appointment as scheduled.  Luther Redo, CMA, am acting as scribe for Sarina Ser, MD . Documentation: I have reviewed the above documentation for accuracy and completeness, and I agree with the above.  Sarina Ser, MD

## 2022-03-03 ENCOUNTER — Encounter: Payer: Self-pay | Admitting: Dermatology

## 2022-04-13 DIAGNOSIS — Z955 Presence of coronary angioplasty implant and graft: Secondary | ICD-10-CM | POA: Diagnosis not present

## 2022-04-13 DIAGNOSIS — Z7902 Long term (current) use of antithrombotics/antiplatelets: Secondary | ICD-10-CM | POA: Diagnosis not present

## 2022-04-13 DIAGNOSIS — Z86010 Personal history of colonic polyps: Secondary | ICD-10-CM | POA: Diagnosis not present

## 2022-04-13 DIAGNOSIS — Z800 Family history of malignant neoplasm of digestive organs: Secondary | ICD-10-CM | POA: Diagnosis not present

## 2022-04-26 ENCOUNTER — Ambulatory Visit: Payer: PPO | Admitting: Dermatology

## 2022-05-07 DIAGNOSIS — R739 Hyperglycemia, unspecified: Secondary | ICD-10-CM | POA: Diagnosis not present

## 2022-05-07 DIAGNOSIS — E782 Mixed hyperlipidemia: Secondary | ICD-10-CM | POA: Diagnosis not present

## 2022-05-07 DIAGNOSIS — Z125 Encounter for screening for malignant neoplasm of prostate: Secondary | ICD-10-CM | POA: Diagnosis not present

## 2022-05-07 DIAGNOSIS — E538 Deficiency of other specified B group vitamins: Secondary | ICD-10-CM | POA: Diagnosis not present

## 2022-05-13 ENCOUNTER — Ambulatory Visit: Payer: PPO

## 2022-05-13 DIAGNOSIS — Z1211 Encounter for screening for malignant neoplasm of colon: Secondary | ICD-10-CM | POA: Diagnosis not present

## 2022-05-13 DIAGNOSIS — K5730 Diverticulosis of large intestine without perforation or abscess without bleeding: Secondary | ICD-10-CM | POA: Diagnosis not present

## 2022-05-13 DIAGNOSIS — K640 First degree hemorrhoids: Secondary | ICD-10-CM | POA: Diagnosis not present

## 2022-05-13 DIAGNOSIS — D120 Benign neoplasm of cecum: Secondary | ICD-10-CM | POA: Diagnosis not present

## 2022-05-13 DIAGNOSIS — Z86010 Personal history of colonic polyps: Secondary | ICD-10-CM | POA: Diagnosis not present

## 2022-05-13 DIAGNOSIS — K5140 Inflammatory polyps of colon without complications: Secondary | ICD-10-CM | POA: Diagnosis not present

## 2022-05-13 DIAGNOSIS — D123 Benign neoplasm of transverse colon: Secondary | ICD-10-CM | POA: Diagnosis not present

## 2022-05-13 DIAGNOSIS — D126 Benign neoplasm of colon, unspecified: Secondary | ICD-10-CM | POA: Diagnosis not present

## 2022-05-21 DIAGNOSIS — E782 Mixed hyperlipidemia: Secondary | ICD-10-CM | POA: Diagnosis not present

## 2022-05-21 DIAGNOSIS — J4530 Mild persistent asthma, uncomplicated: Secondary | ICD-10-CM | POA: Diagnosis not present

## 2022-05-21 DIAGNOSIS — I25711 Atherosclerosis of autologous vein coronary artery bypass graft(s) with angina pectoris with documented spasm: Secondary | ICD-10-CM | POA: Diagnosis not present

## 2022-05-21 DIAGNOSIS — Z0000 Encounter for general adult medical examination without abnormal findings: Secondary | ICD-10-CM | POA: Diagnosis not present

## 2022-05-21 DIAGNOSIS — I5022 Chronic systolic (congestive) heart failure: Secondary | ICD-10-CM | POA: Diagnosis not present

## 2022-05-21 DIAGNOSIS — Z125 Encounter for screening for malignant neoplasm of prostate: Secondary | ICD-10-CM | POA: Diagnosis not present

## 2022-05-21 DIAGNOSIS — N1832 Chronic kidney disease, stage 3b: Secondary | ICD-10-CM | POA: Diagnosis not present

## 2022-06-08 DIAGNOSIS — E782 Mixed hyperlipidemia: Secondary | ICD-10-CM | POA: Diagnosis not present

## 2022-06-08 DIAGNOSIS — I10 Essential (primary) hypertension: Secondary | ICD-10-CM | POA: Diagnosis not present

## 2022-06-08 DIAGNOSIS — I5022 Chronic systolic (congestive) heart failure: Secondary | ICD-10-CM | POA: Diagnosis not present

## 2022-06-08 DIAGNOSIS — Z955 Presence of coronary angioplasty implant and graft: Secondary | ICD-10-CM | POA: Diagnosis not present

## 2022-06-08 DIAGNOSIS — I340 Nonrheumatic mitral (valve) insufficiency: Secondary | ICD-10-CM | POA: Diagnosis not present

## 2022-06-22 DIAGNOSIS — I340 Nonrheumatic mitral (valve) insufficiency: Secondary | ICD-10-CM | POA: Diagnosis not present

## 2022-06-22 DIAGNOSIS — I5022 Chronic systolic (congestive) heart failure: Secondary | ICD-10-CM | POA: Diagnosis not present

## 2022-07-27 DIAGNOSIS — H04123 Dry eye syndrome of bilateral lacrimal glands: Secondary | ICD-10-CM | POA: Diagnosis not present

## 2022-07-27 DIAGNOSIS — H2513 Age-related nuclear cataract, bilateral: Secondary | ICD-10-CM | POA: Diagnosis not present

## 2022-07-27 DIAGNOSIS — H1045 Other chronic allergic conjunctivitis: Secondary | ICD-10-CM | POA: Diagnosis not present

## 2022-08-24 ENCOUNTER — Encounter: Payer: Self-pay | Admitting: Dermatology

## 2022-08-24 ENCOUNTER — Ambulatory Visit: Payer: PPO | Admitting: Dermatology

## 2022-08-24 DIAGNOSIS — Z85828 Personal history of other malignant neoplasm of skin: Secondary | ICD-10-CM

## 2022-08-24 DIAGNOSIS — L821 Other seborrheic keratosis: Secondary | ICD-10-CM | POA: Diagnosis not present

## 2022-08-24 DIAGNOSIS — D1801 Hemangioma of skin and subcutaneous tissue: Secondary | ICD-10-CM

## 2022-08-24 DIAGNOSIS — D229 Melanocytic nevi, unspecified: Secondary | ICD-10-CM

## 2022-08-24 DIAGNOSIS — L814 Other melanin hyperpigmentation: Secondary | ICD-10-CM | POA: Diagnosis not present

## 2022-08-24 DIAGNOSIS — D0422 Carcinoma in situ of skin of left ear and external auricular canal: Secondary | ICD-10-CM | POA: Diagnosis not present

## 2022-08-24 DIAGNOSIS — B079 Viral wart, unspecified: Secondary | ICD-10-CM

## 2022-08-24 DIAGNOSIS — L820 Inflamed seborrheic keratosis: Secondary | ICD-10-CM

## 2022-08-24 DIAGNOSIS — Z1283 Encounter for screening for malignant neoplasm of skin: Secondary | ICD-10-CM | POA: Diagnosis not present

## 2022-08-24 DIAGNOSIS — D099 Carcinoma in situ, unspecified: Secondary | ICD-10-CM

## 2022-08-24 DIAGNOSIS — W908XXA Exposure to other nonionizing radiation, initial encounter: Secondary | ICD-10-CM

## 2022-08-24 DIAGNOSIS — L578 Other skin changes due to chronic exposure to nonionizing radiation: Secondary | ICD-10-CM | POA: Diagnosis not present

## 2022-08-24 DIAGNOSIS — L570 Actinic keratosis: Secondary | ICD-10-CM

## 2022-08-24 DIAGNOSIS — D485 Neoplasm of uncertain behavior of skin: Secondary | ICD-10-CM

## 2022-08-24 HISTORY — DX: Carcinoma in situ, unspecified: D09.9

## 2022-08-24 NOTE — Progress Notes (Signed)
Follow-Up Visit   Subjective  Joseph Cooper is a 72 y.o. male who presents for the following: Skin Cancer Screening and Full Body Skin Exam The patient presents for Total-Body Skin Exam (TBSE) for skin cancer screening and mole check. The patient has spots, moles and lesions to be evaluated, some may be new or changing and the patient may have concern these could be cancer.  The following portions of the chart were reviewed this encounter and updated as appropriate: medications, allergies, medical history  Review of Systems:  No other skin or systemic complaints except as noted in HPI or Assessment and Plan.  Objective  Well appearing patient in no apparent distress; mood and affect are within normal limits.  A full examination was performed including scalp, head, eyes, ears, nose, lips, neck, chest, axillae, abdomen, back, buttocks, bilateral upper extremities, bilateral lower extremities, hands, feet, fingers, toes, fingernails, and toenails. All findings within normal limits unless otherwise noted below.   Relevant physical exam findings are noted in the Assessment and Plan.  Face and ears x 11 (11) Erythematous thin papules/macules with gritty scale.   L earlobe Hyperkeratotic papule 1.3 cm  R nasal tip Uniform light brown macule 1.5 cm      L plantar surface x 1 Verrucous papules -- Discussed viral etiology and contagion. Previous indurated area 1.7 x 1.2 cm, warty area 0.7 cm. Today wart is 0.6 x 0.5 cm.   Assessment & Plan   SKIN CANCER SCREENING PERFORMED TODAY.  ACTINIC DAMAGE - Chronic condition, secondary to cumulative UV/sun exposure - diffuse scaly erythematous macules with underlying dyspigmentation - Recommend daily broad spectrum sunscreen SPF 30+ to sun-exposed areas, reapply every 2 hours as needed.  - Staying in the shade or wearing long sleeves, sun glasses (UVA+UVB protection) and wide brim hats (4-inch brim around the entire circumference of the  hat) are also recommended for sun protection.  - Call for new or changing lesions.  LENTIGINES, SEBORRHEIC KERATOSES, HEMANGIOMAS - Benign normal skin lesions - Benign-appearing - Call for any changes  MELANOCYTIC NEVI - Tan-brown and/or pink-flesh-colored symmetric macules and papules - Benign appearing on exam today - Observation - Call clinic for new or changing moles - Recommend daily use of broad spectrum spf 30+ sunscreen to sun-exposed areas.   AK (actinic keratosis) (11) Face and ears x 11 Destruction of lesion - Face and ears x 11 (11) Complexity: simple   Destruction method: cryotherapy   Informed consent: discussed and consent obtained   Timeout:  patient name, date of birth, surgical site, and procedure verified Lesion destroyed using liquid nitrogen: Yes   Region frozen until ice ball extended beyond lesion: Yes   Outcome: patient tolerated procedure well with no complications   Post-procedure details: wound care instructions given    Neoplasm of uncertain behavior of skin L earlobe Epidermal / dermal shaving  Lesion diameter (cm):  1.3 Informed consent: discussed and consent obtained   Timeout: patient name, date of birth, surgical site, and procedure verified   Procedure prep:  Patient was prepped and draped in usual sterile fashion Prep type:  Isopropyl alcohol Anesthesia: the lesion was anesthetized in a standard fashion   Anesthetic:  1% lidocaine w/ epinephrine 1-100,000 buffered w/ 8.4% NaHCO3 Instrument used: flexible razor blade   Hemostasis achieved with: pressure, aluminum chloride and electrodesiccation   Outcome: patient tolerated procedure well   Post-procedure details: sterile dressing applied and wound care instructions given   Dressing type: bandage and petrolatum  Destruction of lesion Complexity: extensive   Destruction method: electrodesiccation and curettage   Informed consent: discussed and consent obtained   Timeout:  patient name,  date of birth, surgical site, and procedure verified Procedure prep:  Patient was prepped and draped in usual sterile fashion Prep type:  Isopropyl alcohol Anesthesia: the lesion was anesthetized in a standard fashion   Anesthetic:  1% lidocaine w/ epinephrine 1-100,000 buffered w/ 8.4% NaHCO3 Curettage performed in three different directions: Yes   Electrodesiccation performed over the curetted area: Yes   Lesion length (cm):  1.3 Lesion width (cm):  1.3 Margin per side (cm):  0.2 Final wound size (cm):  1.7 Hemostasis achieved with:  pressure, aluminum chloride and electrodesiccation Outcome: patient tolerated procedure well with no complications   Post-procedure details: sterile dressing applied and wound care instructions given   Dressing type: bandage and petrolatum    Specimen 1 - Surgical pathology Differential Diagnosis: D48.5 r/o SCC vs hypertrophic AK  Check Margins: Yes   Inflamed seborrheic keratosis R nasal tip Symptomatic, irritating, patient would like treated. Destruction of lesion - R nasal tip Complexity: simple   Destruction method: cryotherapy   Informed consent: discussed and consent obtained   Timeout:  patient name, date of birth, surgical site, and procedure verified Lesion destroyed using liquid nitrogen: Yes   Region frozen until ice ball extended beyond lesion: Yes   Outcome: patient tolerated procedure well with no complications   Post-procedure details: wound care instructions given    Viral warts, unspecified type L plantar surface x 1 Viral Wart (HPV) Counseling  Discussed viral / HPV (Human Papilloma Virus) etiology and risk of spread /infectivity to other areas of body as well as to other people.  Multiple treatments and methods may be required to clear warts and it is possible treatment may not be successful.  Treatment risks include discoloration; scarring and there is still potential for wart recurrence.  Squaric Acid 3% applied to left upper  inner arm and covered with band aid. Opzelura cream sample gave to patient to use BID if he does get a rash. May add 5FU/SalAcid plaster from [facility] at follow up.  Destruction of lesion - L plantar surface x 1 Complexity: simple   Destruction method: cryotherapy   Informed consent: discussed and consent obtained   Timeout:  patient name, date of birth, surgical site, and procedure verified Lesion destroyed using liquid nitrogen: Yes   Region frozen until ice ball extended beyond lesion: Yes   Outcome: patient tolerated procedure well with no complications   Post-procedure details: wound care instructions given    Destruction of lesion - L plantar surface x 1  Destruction method: chemical removal   Informed consent: discussed and consent obtained   Timeout:  patient name, date of birth, surgical site, and procedure verified Chemical destruction method: cantharidin   Chemical destruction method comment:  Cantharidin plus and squaric acid 3% applied today Application time:  4 hours Procedure instructions: patient instructed to wash and dry area   Outcome: patient tolerated procedure well with no complications   Post-procedure details: wound care instructions given   Additional details:  Patient advised to set alarm to remind them to wash off with soap and water at the directed time.  HISTORY OF BASAL CELL CARCINOMA OF THE SKIN - No evidence of recurrence today - Recommend regular full body skin exams - Recommend daily broad spectrum sunscreen SPF 30+ to sun-exposed areas, reapply every 2 hours as needed.  - Call  if any new or changing lesions are noted between office visits  Return for 3 mths follow up for Ak,wart, bx site recheck.  Maylene Roes, CMA, am acting as scribe for Armida Sans, MD .  Documentation: I have reviewed the above documentation for accuracy and completeness, and I agree with the above.  Armida Sans, MD

## 2022-08-24 NOTE — Patient Instructions (Addendum)
Cantharidin Plus is a blistering agent that comes from a beetle.  It needs to be washed off in about 4 hours after application.  Although it is painless when applied in office, it may cause symptoms of mild pain and burning several hours later.  Treated areas will swell and turn red, and blisters may form.  Vaseline and a bandaid may be applied until wound has healed.  Once healed, the skin may remain temporarily discolored.  It can take weeks to months for pigmentation to return to normal.  Advised to wash off with soap and water in 4-6 hours or sooner if it becomes tender before then.    Electrodesiccation and Curettage ("Scrape and Burn") Wound Care Instructions  Leave the original bandage on for 24 hours if possible.  If the bandage becomes soaked or soiled before that time, it is OK to remove it and examine the wound.  A small amount of post-operative bleeding is normal.  If excessive bleeding occurs, remove the bandage, place gauze over the site and apply continuous pressure (no peeking) over the area for 30 minutes. If this does not work, please call our clinic as soon as possible or page your doctor if it is after hours.   Once a day, cleanse the wound with soap and water. It is fine to shower. If a thick crust develops you may use a Q-tip dipped into dilute hydrogen peroxide (mix 1:1 with water) to dissolve it.  Hydrogen peroxide can slow the healing process, so use it only as needed.    After washing, apply petroleum jelly (Vaseline) or an antibiotic ointment if your doctor prescribed one for you, followed by a bandage.    For best healing, the wound should be covered with a layer of ointment at all times. If you are not able to keep the area covered with a bandage to hold the ointment in place, this may mean re-applying the ointment several times a day.  Continue this wound care until the wound has healed and is no longer open. It may take several weeks for the wound to heal and  close.  Itching and mild discomfort is normal during the healing process.  If you have any discomfort, you can take Tylenol (acetaminophen) or ibuprofen as directed on the bottle. (Please do not take these if you have an allergy to them or cannot take them for another reason).  Some redness, tenderness and white or yellow material in the wound is normal healing.  If the area becomes very sore and red, or develops a thick yellow-green material (pus), it may be infected; please notify us.    Wound healing continues for up to one year following surgery. It is not unusual to experience pain in the scar from time to time during the interval.  If the pain becomes severe or the scar thickens, you should notify the office.    A slight amount of redness in a scar is expected for the first six months.  After six months, the redness will fade and the scar will soften and fade.  The color difference becomes less noticeable with time.  If there are any problems, return for a post-op surgery check at your earliest convenience.  To improve the appearance of the scar, you can use silicone scar gel, cream, or sheets (such as Mederma or Serica) every night for up to one year. These are available over the counter (without a prescription).  Please call our office at [phone] for any  questions or concerns.  Due to recent changes in healthcare laws, you may see results of your pathology and/or laboratory studies on MyChart before the doctors have had a chance to review them. We understand that in some cases there may be results that are confusing or concerning to you. Please understand that not all results are received at the same time and often the doctors may need to interpret multiple results in order to provide you with the best plan of care or course of treatment. Therefore, we ask that you please give [place] 2 business days to thoroughly review all your results before contacting the office for clarification. Should  we see a critical lab result, you will be contacted sooner.   If You Need Anything After Your Visit  If you have any questions or concerns for your doctor, please call our main line at [phone] and press option 4 to reach your doctor's medical assistant. If no one answers, please leave a voicemail as directed and we will return your call as soon as possible. Messages left after 4 pm will be answered the following business day.   You may also send [place] a message via MyChart. We typically respond to MyChart messages within 1-2 business days.  For prescription refills, please ask your pharmacy to contact our office. Our fax number is [phone].  If you have an urgent issue when the clinic is closed that cannot wait until the next business day, you can page your doctor at the number below.    Please note that while we do our best to be available for urgent issues outside of office hours, we are not available 24/7.   If you have an urgent issue and are unable to reach [place], you may choose to seek medical care at your doctor's office, retail clinic, urgent care center, or emergency room.  If you have a medical emergency, please immediately call 911 or go to the emergency department.  Pager Numbers  - Dr. Gwen Pounds: [phone]  - Dr. Neale Burly: [phone]  - Dr. Roseanne Reno: [phone]  In the event of inclement weather, please call our main line at [phone] for an update on the status of any delays or closures.  Dermatology Medication Tips: Please keep the boxes that topical medications come in in order to help keep track of the instructions about where and how to use these. Pharmacies typically print the medication instructions only on the boxes and not directly on the medication tubes.   If your medication is too expensive, please contact our office at [phone] option 4 or send [place] a message through MyChart.   We are unable to tell what your co-pay for medications will be in  advance as this is different depending on your insurance coverage. However, we may be able to find a substitute medication at lower cost or fill out paperwork to get insurance to cover a needed medication.   If a prior authorization is required to get your medication covered by your insurance company, please allow [place] 1-2 business days to complete this process.  Drug prices often vary depending on where the prescription is filled and some pharmacies may offer cheaper prices.  The website www.goodrx.com contains coupons for medications through different pharmacies. The prices here do not account for what the cost may be with help from insurance (it may be cheaper with your insurance), but the website can give you the price if you did not use any insurance.  - You can print the associated  coupon and take it with your prescription to the pharmacy.  - You may also stop by our office during regular business hours and pick up a GoodRx coupon card.  - If you need your prescription sent electronically to a different pharmacy, notify our office through [facility] or by phone at [phone] option 4.     Si Usted Necesita Algo Despus de Su Visita  Tambin puede enviarnos un mensaje a travs de Clinical cytogeneticist. Por lo general respondemos a los mensajes de MyChart en el transcurso de 1 a 2 das hbiles.  Para renovar recetas, por favor pida a su farmacia que se ponga en contacto con nuestra oficina. Annie Sable de fax es [place] [phone].  Si tiene un asunto urgente cuando la clnica est cerrada y que no puede esperar hasta el siguiente da hbil, puede llamar/localizar a su doctor(a) al nmero que aparece a continuacin.   Por favor, tenga en cuenta que aunque hacemos todo lo posible para estar disponibles para asuntos urgentes fuera del horario de [place], no estamos disponibles las 24 horas del da, los 7 [address]  [address] de la [place].   Si tiene un problema urgente y no puede comunicarse con nosotros, puede  optar por buscar atencin mdica  en el consultorio de su doctor(a), en una clnica privada, en un centro de atencin urgente o en una sala de emergencias.  Si tiene Engineer, drilling, por favor llame inmediatamente al 911 o vaya a la sala de emergencias.  Nmeros de bper  - Dr. Gwen Pounds: [phone]  - Dra. Moye: [phone]  - Dra. Roseanne Reno: [phone]  En caso de inclemencias del [place], por favor llame a Lacy Duverney principal al [phone] para una actualizacin sobre el [place] de cualquier retraso o cierre.  Consejos para la medicacin en dermatologa: Por favor, guarde las cajas en las que vienen los medicamentos de uso tpico para ayudarle a seguir las instrucciones sobre dnde y cmo usarlos. Las farmacias generalmente imprimen las instrucciones del medicamento slo en las cajas y no directamente en los tubos del [place].   Si su medicamento es muy caro, por favor, pngase en contacto con Rolm Gala llamando al [phone] y presione la opcin 4 o envenos un mensaje a travs de Clinical cytogeneticist.   No podemos decirle cul ser su copago por los medicamentos por adelantado ya que esto es diferente dependiendo de la cobertura de su seguro. Sin embargo, es posible que podamos encontrar un medicamento sustituto a Audiological scientist un formulario para que el seguro cubra el medicamento que se considera necesario.   Si se requiere una autorizacin previa para que su compaa de seguros [place] su medicamento, por favor permtanos de 1 a 2 das hbiles para completar [address]  Los precios de los medicamentos varan con frecuencia dependiendo del Environmental consultant de dnde se surte la receta y alguna farmacias pueden ofrecer precios ms baratos.  El sitio web www.goodrx.com tiene cupones para medicamentos de Health and safety inspector. Los precios aqu no tienen en cuenta lo que podra costar con la ayuda del seguro (puede ser ms barato con su seguro), pero el sitio web puede darle el  precio si no utiliz Tourist information centre manager.  - Puede imprimir el cupn correspondiente y llevarlo con su receta a la farmacia.  - Tambin puede pasar por nuestra oficina durante el horario de atencin regular y Education officer, museum una tarjeta de cupones de GoodRx.  - Si necesita que su receta se enve electrnicamente a Psychiatrist, informe a nuestra oficina a travs  de MyChart de [place] o por telfono llamando al [phone] y presione la opcin 4.

## 2022-08-31 ENCOUNTER — Telehealth: Payer: Self-pay

## 2022-08-31 NOTE — Telephone Encounter (Signed)
Discussed biopsy results with patient SQUAMOUS CELL CARCINOMA IN SITU   Cancer = SCC in situ  Superficial  Already treated  Recheck next visit

## 2022-08-31 NOTE — Telephone Encounter (Signed)
-----   Message from Armida Sans sent at 08/31/2022  1:09 PM EDT ----- Diagnosis Skin , L earlobe SQUAMOUS CELL CARCINOMA IN SITU  Cancer = SCC in situ Superficial Already treated Recheck next visit

## 2022-11-04 DIAGNOSIS — D485 Neoplasm of uncertain behavior of skin: Secondary | ICD-10-CM | POA: Diagnosis not present

## 2022-11-04 DIAGNOSIS — L570 Actinic keratosis: Secondary | ICD-10-CM | POA: Diagnosis not present

## 2022-11-04 DIAGNOSIS — D0422 Carcinoma in situ of skin of left ear and external auricular canal: Secondary | ICD-10-CM | POA: Diagnosis not present

## 2022-11-04 DIAGNOSIS — C44619 Basal cell carcinoma of skin of left upper limb, including shoulder: Secondary | ICD-10-CM | POA: Diagnosis not present

## 2022-11-04 DIAGNOSIS — Z85828 Personal history of other malignant neoplasm of skin: Secondary | ICD-10-CM | POA: Diagnosis not present

## 2022-11-04 DIAGNOSIS — D2272 Melanocytic nevi of left lower limb, including hip: Secondary | ICD-10-CM | POA: Diagnosis not present

## 2022-11-04 DIAGNOSIS — D225 Melanocytic nevi of trunk: Secondary | ICD-10-CM | POA: Diagnosis not present

## 2022-11-04 DIAGNOSIS — D2262 Melanocytic nevi of left upper limb, including shoulder: Secondary | ICD-10-CM | POA: Diagnosis not present

## 2022-11-04 DIAGNOSIS — D2271 Melanocytic nevi of right lower limb, including hip: Secondary | ICD-10-CM | POA: Diagnosis not present

## 2022-11-04 DIAGNOSIS — X32XXXA Exposure to sunlight, initial encounter: Secondary | ICD-10-CM | POA: Diagnosis not present

## 2022-11-04 DIAGNOSIS — D2261 Melanocytic nevi of right upper limb, including shoulder: Secondary | ICD-10-CM | POA: Diagnosis not present

## 2022-12-02 DIAGNOSIS — C44619 Basal cell carcinoma of skin of left upper limb, including shoulder: Secondary | ICD-10-CM | POA: Diagnosis not present

## 2022-12-07 DIAGNOSIS — Z23 Encounter for immunization: Secondary | ICD-10-CM | POA: Diagnosis not present

## 2022-12-07 DIAGNOSIS — I25810 Atherosclerosis of coronary artery bypass graft(s) without angina pectoris: Secondary | ICD-10-CM | POA: Diagnosis not present

## 2022-12-07 DIAGNOSIS — N1832 Chronic kidney disease, stage 3b: Secondary | ICD-10-CM | POA: Diagnosis not present

## 2022-12-07 DIAGNOSIS — I10 Essential (primary) hypertension: Secondary | ICD-10-CM | POA: Diagnosis not present

## 2022-12-07 DIAGNOSIS — Z955 Presence of coronary angioplasty implant and graft: Secondary | ICD-10-CM | POA: Diagnosis not present

## 2022-12-07 DIAGNOSIS — E782 Mixed hyperlipidemia: Secondary | ICD-10-CM | POA: Diagnosis not present

## 2022-12-14 ENCOUNTER — Ambulatory Visit: Payer: PPO | Admitting: Dermatology

## 2023-01-27 ENCOUNTER — Emergency Department: Payer: PPO

## 2023-01-27 ENCOUNTER — Other Ambulatory Visit: Payer: Self-pay

## 2023-01-27 DIAGNOSIS — Z5321 Procedure and treatment not carried out due to patient leaving prior to being seen by health care provider: Secondary | ICD-10-CM | POA: Diagnosis not present

## 2023-01-27 DIAGNOSIS — R0602 Shortness of breath: Secondary | ICD-10-CM | POA: Insufficient documentation

## 2023-01-27 DIAGNOSIS — R079 Chest pain, unspecified: Secondary | ICD-10-CM | POA: Diagnosis not present

## 2023-01-27 DIAGNOSIS — R0789 Other chest pain: Secondary | ICD-10-CM | POA: Insufficient documentation

## 2023-01-27 LAB — BASIC METABOLIC PANEL
Anion gap: 10 (ref 5–15)
BUN: 23 mg/dL (ref 8–23)
CO2: 27 mmol/L (ref 22–32)
Calcium: 9.2 mg/dL (ref 8.9–10.3)
Chloride: 101 mmol/L (ref 98–111)
Creatinine, Ser: 1.39 mg/dL — ABNORMAL HIGH (ref 0.61–1.24)
GFR, Estimated: 54 mL/min — ABNORMAL LOW (ref >60)
Glucose, Bld: 101 mg/dL — ABNORMAL HIGH (ref 70–99)
Potassium: 3.6 mmol/L (ref 3.5–5.1)
Sodium: 138 mmol/L (ref 135–145)

## 2023-01-27 LAB — CBC
HCT: 40.5 % (ref 39.0–52.0)
Hemoglobin: 14.3 g/dL (ref 13.0–17.0)
MCH: 32.7 pg (ref 26.0–34.0)
MCHC: 35.3 g/dL (ref 30.0–36.0)
MCV: 92.7 fL (ref 80.0–100.0)
Platelets: 175 10*3/uL (ref 150–400)
RBC: 4.37 MIL/uL (ref 4.22–5.81)
RDW: 12.2 % (ref 11.5–15.5)
WBC: 8.8 10*3/uL (ref 4.0–10.5)
nRBC: 0 % (ref 0.0–0.2)

## 2023-01-27 LAB — TROPONIN I (HIGH SENSITIVITY)
Troponin I (High Sensitivity): 32 ng/L — ABNORMAL HIGH (ref <18)
Troponin I (High Sensitivity): 54 ng/L — ABNORMAL HIGH (ref <18)

## 2023-01-27 NOTE — ED Triage Notes (Signed)
Pt to ED For chest tightness and shob started 2 hours ago. Also reports HTN, took meds today.

## 2023-01-27 NOTE — ED Provider Triage Note (Signed)
Emergency Medicine Provider Triage Evaluation Note  Joseph Cooper , a 72 y.o. male  was evaluated in triage.  Pt complains of chest tightness and shortness of breath  x 2 hours. Two previous MI. Symptoms today are similar, but pain is not as bad. He also feels his BP is elevated even after taking his medications.  Physical Exam  Ht 5\' 9"  (1.753 m)   Wt 81.6 kg   BMI 26.58 kg/m  Gen:   Awake, no distress   Resp:  Normal effort  MSK:   Moves extremities without difficulty  Other:    Medical Decision Making  Medically screening exam initiated at 4:41 PM.  Appropriate orders placed.  Louanna Raw was informed that the remainder of the evaluation will be completed by another provider, this initial triage assessment does not replace that evaluation, and the importance of remaining in the ED until their evaluation is complete.  Chest pain workup started.   Chinita Pester, FNP 01/27/23 1643

## 2023-01-28 ENCOUNTER — Emergency Department
Admission: EM | Admit: 2023-01-28 | Discharge: 2023-01-28 | Discharge status: Against Medical Advice | Payer: PPO | Attending: Emergency Medicine | Admitting: Emergency Medicine

## 2023-01-28 ENCOUNTER — Telehealth: Payer: Self-pay | Admitting: Emergency Medicine

## 2023-01-28 DIAGNOSIS — I25810 Atherosclerosis of coronary artery bypass graft(s) without angina pectoris: Secondary | ICD-10-CM | POA: Diagnosis not present

## 2023-01-28 DIAGNOSIS — I10 Essential (primary) hypertension: Secondary | ICD-10-CM | POA: Diagnosis not present

## 2023-01-28 DIAGNOSIS — N1832 Chronic kidney disease, stage 3b: Secondary | ICD-10-CM | POA: Diagnosis not present

## 2023-01-28 DIAGNOSIS — E782 Mixed hyperlipidemia: Secondary | ICD-10-CM | POA: Diagnosis not present

## 2023-01-28 DIAGNOSIS — Z955 Presence of coronary angioplasty implant and graft: Secondary | ICD-10-CM | POA: Diagnosis not present

## 2023-01-28 NOTE — Telephone Encounter (Signed)
Called patient due to left emergency department before provider exam to inquire about condition and follow up plans. Left message that he should review labs and have his doctor review labs.  I left my phone number, but due to ED surge, I have to be out on the floor.

## 2023-01-28 NOTE — ED Notes (Signed)
No answer when called several times from lobby 

## 2023-05-23 DIAGNOSIS — E782 Mixed hyperlipidemia: Secondary | ICD-10-CM | POA: Diagnosis not present

## 2023-05-23 DIAGNOSIS — Z125 Encounter for screening for malignant neoplasm of prostate: Secondary | ICD-10-CM | POA: Diagnosis not present

## 2023-05-25 DIAGNOSIS — L988 Other specified disorders of the skin and subcutaneous tissue: Secondary | ICD-10-CM | POA: Diagnosis not present

## 2023-05-25 DIAGNOSIS — L578 Other skin changes due to chronic exposure to nonionizing radiation: Secondary | ICD-10-CM | POA: Diagnosis not present

## 2023-05-25 DIAGNOSIS — L814 Other melanin hyperpigmentation: Secondary | ICD-10-CM | POA: Diagnosis not present

## 2023-05-25 DIAGNOSIS — C44619 Basal cell carcinoma of skin of left upper limb, including shoulder: Secondary | ICD-10-CM | POA: Diagnosis not present

## 2023-06-07 DIAGNOSIS — E782 Mixed hyperlipidemia: Secondary | ICD-10-CM | POA: Diagnosis not present

## 2023-06-07 DIAGNOSIS — I6523 Occlusion and stenosis of bilateral carotid arteries: Secondary | ICD-10-CM | POA: Diagnosis not present

## 2023-06-07 DIAGNOSIS — Z955 Presence of coronary angioplasty implant and graft: Secondary | ICD-10-CM | POA: Diagnosis not present

## 2023-06-07 DIAGNOSIS — I25810 Atherosclerosis of coronary artery bypass graft(s) without angina pectoris: Secondary | ICD-10-CM | POA: Diagnosis not present

## 2023-06-07 DIAGNOSIS — I10 Essential (primary) hypertension: Secondary | ICD-10-CM | POA: Diagnosis not present

## 2023-06-08 DIAGNOSIS — R739 Hyperglycemia, unspecified: Secondary | ICD-10-CM | POA: Diagnosis not present

## 2023-06-08 DIAGNOSIS — E782 Mixed hyperlipidemia: Secondary | ICD-10-CM | POA: Diagnosis not present

## 2023-06-08 DIAGNOSIS — Z125 Encounter for screening for malignant neoplasm of prostate: Secondary | ICD-10-CM | POA: Diagnosis not present

## 2023-06-08 DIAGNOSIS — N1832 Chronic kidney disease, stage 3b: Secondary | ICD-10-CM | POA: Diagnosis not present

## 2023-06-08 DIAGNOSIS — I25810 Atherosclerosis of coronary artery bypass graft(s) without angina pectoris: Secondary | ICD-10-CM | POA: Diagnosis not present

## 2023-06-08 DIAGNOSIS — Z0000 Encounter for general adult medical examination without abnormal findings: Secondary | ICD-10-CM | POA: Diagnosis not present

## 2023-06-24 DIAGNOSIS — I6523 Occlusion and stenosis of bilateral carotid arteries: Secondary | ICD-10-CM | POA: Diagnosis not present

## 2023-10-04 DIAGNOSIS — B354 Tinea corporis: Secondary | ICD-10-CM | POA: Diagnosis not present

## 2023-10-04 DIAGNOSIS — C44519 Basal cell carcinoma of skin of other part of trunk: Secondary | ICD-10-CM | POA: Diagnosis not present

## 2023-10-04 DIAGNOSIS — C44619 Basal cell carcinoma of skin of left upper limb, including shoulder: Secondary | ICD-10-CM | POA: Diagnosis not present

## 2023-10-04 DIAGNOSIS — C44712 Basal cell carcinoma of skin of right lower limb, including hip: Secondary | ICD-10-CM | POA: Diagnosis not present

## 2023-10-04 DIAGNOSIS — D0462 Carcinoma in situ of skin of left upper limb, including shoulder: Secondary | ICD-10-CM | POA: Diagnosis not present

## 2023-10-04 DIAGNOSIS — Z85828 Personal history of other malignant neoplasm of skin: Secondary | ICD-10-CM | POA: Diagnosis not present

## 2023-10-04 DIAGNOSIS — D225 Melanocytic nevi of trunk: Secondary | ICD-10-CM | POA: Diagnosis not present

## 2023-10-04 DIAGNOSIS — D2272 Melanocytic nevi of left lower limb, including hip: Secondary | ICD-10-CM | POA: Diagnosis not present

## 2023-10-04 DIAGNOSIS — D2262 Melanocytic nevi of left upper limb, including shoulder: Secondary | ICD-10-CM | POA: Diagnosis not present

## 2023-10-04 DIAGNOSIS — D2271 Melanocytic nevi of right lower limb, including hip: Secondary | ICD-10-CM | POA: Diagnosis not present

## 2023-10-04 DIAGNOSIS — D2261 Melanocytic nevi of right upper limb, including shoulder: Secondary | ICD-10-CM | POA: Diagnosis not present

## 2023-10-04 DIAGNOSIS — D485 Neoplasm of uncertain behavior of skin: Secondary | ICD-10-CM | POA: Diagnosis not present

## 2023-10-04 DIAGNOSIS — C44719 Basal cell carcinoma of skin of left lower limb, including hip: Secondary | ICD-10-CM | POA: Diagnosis not present

## 2023-10-04 DIAGNOSIS — C44612 Basal cell carcinoma of skin of right upper limb, including shoulder: Secondary | ICD-10-CM | POA: Diagnosis not present

## 2023-10-04 DIAGNOSIS — L570 Actinic keratosis: Secondary | ICD-10-CM | POA: Diagnosis not present

## 2023-10-04 DIAGNOSIS — D0422 Carcinoma in situ of skin of left ear and external auricular canal: Secondary | ICD-10-CM | POA: Diagnosis not present

## 2023-10-04 DIAGNOSIS — L821 Other seborrheic keratosis: Secondary | ICD-10-CM | POA: Diagnosis not present

## 2023-10-04 DIAGNOSIS — Z08 Encounter for follow-up examination after completed treatment for malignant neoplasm: Secondary | ICD-10-CM | POA: Diagnosis not present

## 2023-11-01 DIAGNOSIS — M542 Cervicalgia: Secondary | ICD-10-CM | POA: Diagnosis not present

## 2023-11-01 DIAGNOSIS — M9901 Segmental and somatic dysfunction of cervical region: Secondary | ICD-10-CM | POA: Diagnosis not present

## 2023-11-01 DIAGNOSIS — M9902 Segmental and somatic dysfunction of thoracic region: Secondary | ICD-10-CM | POA: Diagnosis not present

## 2023-11-01 DIAGNOSIS — M62830 Muscle spasm of back: Secondary | ICD-10-CM | POA: Diagnosis not present

## 2023-11-02 DIAGNOSIS — M9902 Segmental and somatic dysfunction of thoracic region: Secondary | ICD-10-CM | POA: Diagnosis not present

## 2023-11-02 DIAGNOSIS — M9901 Segmental and somatic dysfunction of cervical region: Secondary | ICD-10-CM | POA: Diagnosis not present

## 2023-11-02 DIAGNOSIS — D3701 Neoplasm of uncertain behavior of lip: Secondary | ICD-10-CM | POA: Diagnosis not present

## 2023-11-02 DIAGNOSIS — M542 Cervicalgia: Secondary | ICD-10-CM | POA: Diagnosis not present

## 2023-11-02 DIAGNOSIS — L570 Actinic keratosis: Secondary | ICD-10-CM | POA: Diagnosis not present

## 2023-11-02 DIAGNOSIS — L820 Inflamed seborrheic keratosis: Secondary | ICD-10-CM | POA: Diagnosis not present

## 2023-11-02 DIAGNOSIS — M62830 Muscle spasm of back: Secondary | ICD-10-CM | POA: Diagnosis not present

## 2023-11-07 DIAGNOSIS — M9902 Segmental and somatic dysfunction of thoracic region: Secondary | ICD-10-CM | POA: Diagnosis not present

## 2023-11-07 DIAGNOSIS — M9901 Segmental and somatic dysfunction of cervical region: Secondary | ICD-10-CM | POA: Diagnosis not present

## 2023-11-07 DIAGNOSIS — M542 Cervicalgia: Secondary | ICD-10-CM | POA: Diagnosis not present

## 2023-11-07 DIAGNOSIS — M62830 Muscle spasm of back: Secondary | ICD-10-CM | POA: Diagnosis not present

## 2023-12-12 DIAGNOSIS — H538 Other visual disturbances: Secondary | ICD-10-CM | POA: Diagnosis not present

## 2023-12-12 DIAGNOSIS — I25810 Atherosclerosis of coronary artery bypass graft(s) without angina pectoris: Secondary | ICD-10-CM | POA: Diagnosis not present

## 2023-12-12 DIAGNOSIS — I10 Essential (primary) hypertension: Secondary | ICD-10-CM | POA: Diagnosis not present

## 2023-12-12 DIAGNOSIS — R42 Dizziness and giddiness: Secondary | ICD-10-CM | POA: Diagnosis not present

## 2023-12-13 DIAGNOSIS — H1045 Other chronic allergic conjunctivitis: Secondary | ICD-10-CM | POA: Diagnosis not present

## 2023-12-13 DIAGNOSIS — I10 Essential (primary) hypertension: Secondary | ICD-10-CM | POA: Diagnosis not present

## 2023-12-13 DIAGNOSIS — E782 Mixed hyperlipidemia: Secondary | ICD-10-CM | POA: Diagnosis not present

## 2023-12-13 DIAGNOSIS — R413 Other amnesia: Secondary | ICD-10-CM | POA: Diagnosis not present

## 2023-12-13 DIAGNOSIS — Z955 Presence of coronary angioplasty implant and graft: Secondary | ICD-10-CM | POA: Diagnosis not present

## 2023-12-13 DIAGNOSIS — R42 Dizziness and giddiness: Secondary | ICD-10-CM | POA: Diagnosis not present

## 2023-12-13 DIAGNOSIS — I6523 Occlusion and stenosis of bilateral carotid arteries: Secondary | ICD-10-CM | POA: Diagnosis not present

## 2023-12-13 DIAGNOSIS — H2513 Age-related nuclear cataract, bilateral: Secondary | ICD-10-CM | POA: Diagnosis not present

## 2023-12-13 DIAGNOSIS — N1832 Chronic kidney disease, stage 3b: Secondary | ICD-10-CM | POA: Diagnosis not present

## 2023-12-13 DIAGNOSIS — H5203 Hypermetropia, bilateral: Secondary | ICD-10-CM | POA: Diagnosis not present

## 2023-12-13 DIAGNOSIS — H04123 Dry eye syndrome of bilateral lacrimal glands: Secondary | ICD-10-CM | POA: Diagnosis not present

## 2023-12-13 DIAGNOSIS — I25810 Atherosclerosis of coronary artery bypass graft(s) without angina pectoris: Secondary | ICD-10-CM | POA: Diagnosis not present

## 2023-12-14 ENCOUNTER — Other Ambulatory Visit: Payer: Self-pay | Admitting: Physician Assistant

## 2023-12-14 ENCOUNTER — Ambulatory Visit
Admission: RE | Admit: 2023-12-14 | Discharge: 2023-12-14 | Disposition: A | Discharge status: Home / Self Care | Source: Ambulatory Visit | Attending: Physician Assistant | Admitting: Physician Assistant

## 2023-12-14 DIAGNOSIS — H539 Unspecified visual disturbance: Secondary | ICD-10-CM | POA: Diagnosis not present

## 2023-12-14 DIAGNOSIS — R42 Dizziness and giddiness: Secondary | ICD-10-CM | POA: Diagnosis not present

## 2023-12-14 DIAGNOSIS — Z6827 Body mass index (BMI) 27.0-27.9, adult: Secondary | ICD-10-CM | POA: Diagnosis not present

## 2023-12-14 DIAGNOSIS — R4189 Other symptoms and signs involving cognitive functions and awareness: Secondary | ICD-10-CM | POA: Diagnosis not present

## 2023-12-14 DIAGNOSIS — I639 Cerebral infarction, unspecified: Secondary | ICD-10-CM | POA: Diagnosis not present

## 2023-12-14 DIAGNOSIS — H538 Other visual disturbances: Secondary | ICD-10-CM | POA: Diagnosis not present

## 2023-12-14 DIAGNOSIS — I6782 Cerebral ischemia: Secondary | ICD-10-CM | POA: Diagnosis not present

## 2023-12-14 DIAGNOSIS — I10 Essential (primary) hypertension: Secondary | ICD-10-CM | POA: Diagnosis not present

## 2023-12-15 ENCOUNTER — Ambulatory Visit
Admission: RE | Admit: 2023-12-15 | Discharge: 2023-12-15 | Disposition: A | Discharge status: Home / Self Care | Source: Ambulatory Visit | Attending: Internal Medicine | Admitting: Internal Medicine

## 2023-12-15 ENCOUNTER — Other Ambulatory Visit: Payer: Self-pay | Admitting: Internal Medicine

## 2023-12-15 ENCOUNTER — Other Ambulatory Visit: Payer: Self-pay | Admitting: Family Medicine

## 2023-12-15 DIAGNOSIS — C714 Malignant neoplasm of occipital lobe: Secondary | ICD-10-CM

## 2023-12-15 DIAGNOSIS — C719 Malignant neoplasm of brain, unspecified: Secondary | ICD-10-CM | POA: Diagnosis not present

## 2023-12-15 DIAGNOSIS — R220 Localized swelling, mass and lump, head: Secondary | ICD-10-CM | POA: Diagnosis not present

## 2023-12-15 DIAGNOSIS — G936 Cerebral edema: Secondary | ICD-10-CM | POA: Diagnosis not present

## 2023-12-15 DIAGNOSIS — I25810 Atherosclerosis of coronary artery bypass graft(s) without angina pectoris: Secondary | ICD-10-CM | POA: Diagnosis not present

## 2023-12-15 MED ORDER — GADOBUTROL 1 MMOL/ML IV SOLN
7.5000 mL | Freq: Once | INTRAVENOUS | Status: AC | PRN
  Administered 2023-12-15: 7.5 mL via INTRAVENOUS

## 2023-12-23 DIAGNOSIS — R9389 Abnormal findings on diagnostic imaging of other specified body structures: Secondary | ICD-10-CM | POA: Diagnosis not present

## 2023-12-23 DIAGNOSIS — D496 Neoplasm of unspecified behavior of brain: Secondary | ICD-10-CM | POA: Diagnosis not present

## 2023-12-23 DIAGNOSIS — Z01818 Encounter for other preprocedural examination: Secondary | ICD-10-CM | POA: Diagnosis not present

## 2023-12-23 DIAGNOSIS — R634 Abnormal weight loss: Secondary | ICD-10-CM | POA: Diagnosis not present

## 2023-12-26 DIAGNOSIS — Z955 Presence of coronary angioplasty implant and graft: Secondary | ICD-10-CM | POA: Diagnosis not present

## 2023-12-26 DIAGNOSIS — E782 Mixed hyperlipidemia: Secondary | ICD-10-CM | POA: Diagnosis not present

## 2023-12-26 DIAGNOSIS — I10 Essential (primary) hypertension: Secondary | ICD-10-CM | POA: Diagnosis not present

## 2023-12-26 DIAGNOSIS — D496 Neoplasm of unspecified behavior of brain: Secondary | ICD-10-CM | POA: Diagnosis not present

## 2023-12-26 DIAGNOSIS — R079 Chest pain, unspecified: Secondary | ICD-10-CM | POA: Diagnosis not present

## 2024-01-04 DIAGNOSIS — C719 Malignant neoplasm of brain, unspecified: Secondary | ICD-10-CM | POA: Diagnosis not present

## 2024-01-04 DIAGNOSIS — C714 Malignant neoplasm of occipital lobe: Secondary | ICD-10-CM | POA: Diagnosis not present

## 2024-01-09 DIAGNOSIS — C714 Malignant neoplasm of occipital lobe: Secondary | ICD-10-CM | POA: Diagnosis not present

## 2024-01-11 DIAGNOSIS — C719 Malignant neoplasm of brain, unspecified: Secondary | ICD-10-CM | POA: Diagnosis not present
